# Patient Record
Sex: Male | Born: 1990 | Race: White | Hispanic: No | Marital: Single | Smoking: Current every day smoker
Health system: Southern US, Community
[De-identification: ages and names within clinical notes are randomized; demographics above are authoritative.]

## PROBLEM LIST (undated history)

## (undated) DIAGNOSIS — F909 Attention-deficit hyperactivity disorder, unspecified type: Secondary | ICD-10-CM

## (undated) DIAGNOSIS — J45909 Unspecified asthma, uncomplicated: Secondary | ICD-10-CM

## (undated) DIAGNOSIS — N200 Calculus of kidney: Secondary | ICD-10-CM

## (undated) DIAGNOSIS — I10 Essential (primary) hypertension: Secondary | ICD-10-CM

## (undated) HISTORY — PX: FRACTURE SURGERY: SHX138

## (undated) HISTORY — DX: Unspecified asthma, uncomplicated: J45.909

## (undated) HISTORY — DX: Attention-deficit hyperactivity disorder, unspecified type: F90.9

---

## 2011-05-09 ENCOUNTER — Ambulatory Visit (INDEPENDENT_AMBULATORY_CARE_PROVIDER_SITE_OTHER): Payer: Managed Care, Other (non HMO)

## 2011-05-09 DIAGNOSIS — M545 Low back pain: Secondary | ICD-10-CM

## 2011-05-09 DIAGNOSIS — J111 Influenza due to unidentified influenza virus with other respiratory manifestations: Secondary | ICD-10-CM

## 2011-05-09 DIAGNOSIS — R030 Elevated blood-pressure reading, without diagnosis of hypertension: Secondary | ICD-10-CM

## 2011-08-09 ENCOUNTER — Ambulatory Visit (INDEPENDENT_AMBULATORY_CARE_PROVIDER_SITE_OTHER): Payer: Managed Care, Other (non HMO) | Admitting: Internal Medicine

## 2011-08-09 VITALS — BP 124/90 | HR 80 | Temp 98.9°F | Resp 16 | Ht 66.0 in | Wt 181.2 lb

## 2011-08-09 DIAGNOSIS — M545 Low back pain: Secondary | ICD-10-CM

## 2011-08-09 DIAGNOSIS — R109 Unspecified abdominal pain: Secondary | ICD-10-CM

## 2011-08-09 DIAGNOSIS — R509 Fever, unspecified: Secondary | ICD-10-CM

## 2011-08-09 DIAGNOSIS — R1084 Generalized abdominal pain: Secondary | ICD-10-CM

## 2011-08-09 LAB — POCT CBC
Granulocyte percent: 53.5 %G (ref 37–80)
MCH, POC: 28.1 pg (ref 27–31.2)
MID (cbc): 0.7 (ref 0–0.9)
MPV: 9.1 fL (ref 0–99.8)
POC MID %: 10.7 %M (ref 0–12)
Platelet Count, POC: 189 10*3/uL (ref 142–424)
RBC: 5.72 M/uL (ref 4.69–6.13)

## 2011-08-09 LAB — POCT UA - MICROSCOPIC ONLY
Amorphous: POSITIVE
Casts, Ur, LPF, POC: NEGATIVE

## 2011-08-09 LAB — POCT URINALYSIS DIPSTICK
Leukocytes, UA: NEGATIVE
Nitrite, UA: NEGATIVE
Protein, UA: 30

## 2011-08-09 LAB — COMPREHENSIVE METABOLIC PANEL
ALT: 79 U/L — ABNORMAL HIGH (ref 0–53)
AST: 70 U/L — ABNORMAL HIGH (ref 0–37)
Alkaline Phosphatase: 80 U/L (ref 39–117)
CO2: 27 mEq/L (ref 19–32)
Sodium: 138 mEq/L (ref 135–145)
Total Bilirubin: 0.6 mg/dL (ref 0.3–1.2)
Total Protein: 7.2 g/dL (ref 6.0–8.3)

## 2011-08-09 LAB — POCT SEDIMENTATION RATE: POCT SED RATE: 5 mm/hr (ref 0–22)

## 2011-08-09 MED ORDER — ONDANSETRON HCL 4 MG PO TABS: 4.0000 mg | ORAL_TABLET | Freq: Three times a day (TID) | ORAL | Status: AC | PRN

## 2011-08-09 MED ORDER — ONDANSETRON 4 MG PO TBDP
8.0000 mg | ORAL_TABLET | Freq: Once | ORAL | Status: AC
  Administered 2011-08-09: 8 mg via ORAL

## 2011-08-09 MED ORDER — KETOROLAC TROMETHAMINE 60 MG/2ML IM SOLN
60.0000 mg | Freq: Once | INTRAMUSCULAR | Status: AC
  Administered 2011-08-09: 60 mg via INTRAMUSCULAR

## 2011-08-09 NOTE — Progress Notes (Signed)
  Subjective:    Patient ID: Cameron Bradshaw, male    DOB: 20-May-1990, 21 y.o.   MRN: [id number]  Abdominal Pain This is a new problem. The current episode started in the past 7 days. The onset quality is gradual. The problem occurs constantly. The problem has been gradually worsening. The pain is located in the generalized abdominal region. The pain is at a severity of 5/10. The quality of the pain is aching and sharp. The abdominal pain does not radiate. Associated symptoms include anorexia, constipation and vomiting. Pertinent negatives include no diarrhea or dysuria. The pain is aggravated by coughing. The pain is relieved by nothing. He has tried nothing for the symptoms. There is no history of abdominal surgery.  Cameron Bradshaw is a 21 year old WM here with complaints of 3 days of generalized abdominal pain, worse when he coughs.  He has had fever and myalgias the last few days, roommate has recently had a viral illness of some kind.  He "always has a cough" and has some allergies, having undergone immunotherapy in the past.  He has had little appetite but tells me he has been drinking a lot of water lately.    Review of Systems  HENT: Negative for ear pain.   Eyes: Negative.   Respiratory: Positive for cough.   Cardiovascular: Negative.   Gastrointestinal: Positive for vomiting, abdominal pain, constipation and anorexia. Negative for diarrhea.  Genitourinary: Negative for dysuria, flank pain and discharge.  Musculoskeletal: Negative.   Skin: Negative.   Neurological: Negative.   Hematological: Negative.   Psychiatric/Behavioral: Negative.   All other systems reviewed and are negative.       Objective:   Physical Exam  Vitals reviewed. Constitutional: He is oriented to person, place, and time. He appears well-developed and well-nourished.  HENT:  Head: Normocephalic.  Right Ear: External ear normal.  Left Ear: External ear normal.  Mouth/Throat: Oropharynx is clear and moist. No  oropharyngeal exudate.  Eyes: Conjunctivae are normal.  Neck: Neck supple.  Cardiovascular: Normal rate, regular rhythm and normal heart sounds.   Pulmonary/Chest: Effort normal and breath sounds normal. He has no wheezes. He has no rales. He exhibits no tenderness.  Abdominal: Soft. Bowel sounds are normal. He exhibits no distension and no mass. There is tenderness. There is no rebound and no guarding.  Lymphadenopathy:    He has no cervical adenopathy.  Neurological: He is alert and oriented to person, place, and time.  Skin: Skin is warm and dry.  Psychiatric: He has a normal mood and affect. His behavior is normal.          Assessment & Plan:  Viral syndrome:  Toradol 60 mg given for his myalgias and back pain.  Rest, extra fluids. ESR 5, no sign of systemic inflammation. Nausea improved with Zofran given orally in the office.  His abdominal pain was significantly improved after he moved his bowels in the office.  No sign of acute abdomen or signs of pathology.  CMP pending. Supportive care, BRAT diet, fluids.  If abdominal pain worsens to ED, pt agrees.

## 2011-08-09 NOTE — Patient Instructions (Signed)
You are being treated for a viral syndrome.  Make sure you get extra rest, adequate fluids, and use the Zofran as needed for nausea.  If your abdominal pain worsens, or any other symptoms arise or worsen, return to our clinic or go to the Emergency Room.

## 2011-08-16 ENCOUNTER — Ambulatory Visit (INDEPENDENT_AMBULATORY_CARE_PROVIDER_SITE_OTHER): Payer: Managed Care, Other (non HMO) | Admitting: Family Medicine

## 2011-08-16 VITALS — BP 151/104 | HR 99 | Temp 99.7°F | Resp 16 | Ht 66.5 in | Wt 178.6 lb

## 2011-08-16 DIAGNOSIS — Z202 Contact with and (suspected) exposure to infections with a predominantly sexual mode of transmission: Secondary | ICD-10-CM

## 2011-08-16 DIAGNOSIS — J029 Acute pharyngitis, unspecified: Secondary | ICD-10-CM

## 2011-08-16 DIAGNOSIS — K5900 Constipation, unspecified: Secondary | ICD-10-CM

## 2011-08-16 LAB — POCT CBC
Granulocyte percent: 38.9 %G (ref 37–80)
HCT, POC: 48.7 % (ref 43.5–53.7)
Hemoglobin: 16.6 g/dL (ref 14.1–18.1)
Lymph, poc: 8.7 — AB (ref 0.6–3.4)
MCH, POC: 28.2 pg (ref 27–31.2)
MCHC: 34.1 g/dL (ref 31.8–35.4)
MCV: 82.8 fL (ref 80–97)
MID (cbc): 1.8 — AB (ref 0–0.9)
MPV: 8.8 fL (ref 0–99.8)
POC Granulocyte: 6.7 (ref 2–6.9)
POC LYMPH PERCENT: 50.8 %L — AB (ref 10–50)
POC MID %: 10.3 %M (ref 0–12)
Platelet Count, POC: 255 10*3/uL (ref 142–424)
RBC: 5.88 M/uL (ref 4.69–6.13)
RDW, POC: 15.4 %
WBC: 17.2 10*3/uL — AB (ref 4.6–10.2)

## 2011-08-16 LAB — COMPREHENSIVE METABOLIC PANEL
ALT: 342 U/L — ABNORMAL HIGH (ref 0–53)
AST: 177 U/L — ABNORMAL HIGH (ref 0–37)
Albumin: 4.2 g/dL (ref 3.5–5.2)
Alkaline Phosphatase: 142 U/L — ABNORMAL HIGH (ref 39–117)
BUN: 11 mg/dL (ref 6–23)
CO2: 22 mEq/L (ref 19–32)
Calcium: 8.7 mg/dL (ref 8.4–10.5)
Chloride: 101 mEq/L (ref 96–112)
Creat: 1.03 mg/dL (ref 0.50–1.35)
Glucose, Bld: 120 mg/dL — ABNORMAL HIGH (ref 70–99)
Potassium: 3.3 mEq/L — ABNORMAL LOW (ref 3.5–5.3)
Sodium: 137 mEq/L (ref 135–145)
Total Bilirubin: 0.8 mg/dL (ref 0.3–1.2)
Total Protein: 7.1 g/dL (ref 6.0–8.3)

## 2011-08-16 LAB — POCT RAPID STREP A (OFFICE): Rapid Strep A Screen: NEGATIVE

## 2011-08-16 MED ORDER — CEFTRIAXONE SODIUM 1 G IJ SOLR
1.0000 g | INTRAMUSCULAR | Status: DC
  Administered 2011-08-16: 1 g via INTRAMUSCULAR

## 2011-08-16 MED ORDER — AMOXICILLIN-POT CLAVULANATE 875-125 MG PO TABS: 1.0000 | ORAL_TABLET | Freq: Two times a day (BID) | ORAL | Status: AC

## 2011-08-16 NOTE — Patient Instructions (Signed)
RETURN ON Sunday FOR RECHECK    Peritonsillar Abscess A peritonsillar abscess is a collection of pus located in the back of the throat behind the tonsils. It usually occurs when a streptococcal infection of the throat or tonsils spreads into the space around the tonsils. They are almost always caused by the streptococcal germ (bacteria). The treatment of a peritonsillar abscess is most often drainage accomplished by putting a needle into the abscess or cutting (incising) and draining the abscess. This is most often followed with a course of antibiotics. HOME CARE INSTRUCTIONS  If your abscess was drained by your caregiver today, rinse your throat (gargle) with warm salt water four times per day or as needed for comfort. Do not swallow this mixture. Mix 1 teaspoon of salt in 8 ounces of warm water for gargling.   Rest in bed as needed. Resume activities as able.   Apply cold to your neck for pain relief. Fill a plastic bag with ice and wrap it in a towel. Hold the ice on your neck for 20 minutes 4 times per day.   Eat a soft or liquid diet as tolerated while your throat remains sore. Popsicles and ice cream may be good early choices. Drinking plenty of cold fluids will probably be soothing and help take swelling down in between the warm gargles.   Only take over-the-counter or prescription medicines for pain, discomfort, or fever as directed by your caregiver. Do not use aspirin unless directed by your physician. Aspirin slows down the clotting process. It can also cause bleeding from the drainage area if this was needled or incised today.   If antibiotics were prescribed, take them as directed for the full course of the prescription. Even if you feel you are well, you need to take them.  SEEK MEDICAL CARE IF:   You have increased pain, swelling, redness, or drainage in your throat.   You develop signs of infection such as dizziness, headache, lethargy, or generalized feelings of illness.    You have difficulty breathing, swallowing or eating.   You show signs of becoming dehydrated (lightheadedness when standing, decreased urine output, a fast heart rate, or dry mouth and mucous membranes).  SEEK IMMEDIATE MEDICAL CARE IF:   You have a fever.   You are coughing up or vomiting blood.   You develop more severe throat pain uncontrolled with medicines or you start to drool.   You develop difficulty breathing, talking, or find it easier to breathe while leaning forward.  Document Released: 04/29/2005 Document Revised: 04/18/2011 Document Reviewed: 12/11/2007 [facility] Patient Information 2012 [place], [place].

## 2011-08-16 NOTE — Progress Notes (Signed)
Is a 21 year old gentleman comes in with severe sore throat x3 days. He was here last week with malaise and vague abdominal pain. He still feels a little constipated but his abdominal symptoms of largely clear.  Patient was here last time, he was also feeling very lightheaded and ended up passing out in the bathroom after his blood was drawn. He's very reluctant to have his drawn again She also would like to have STD screening done while he is here.  O: Patient appears acutely ill lying down on the exam bed. He is alert and cooperative  HEENT: Unremarkable except for swollen red posterior pharynx without exudates  Neck: Mild anterior cervical adenopathy with no thyromegaly, supple,  Chest: Clear  Heart: Regular no murmur  Abdomen: Soft nontender without hepatosplenomegaly.  RST:  neg  Results for orders placed in visit on 08/16/11  POCT CBC      Component Value Range   WBC 17.2 (*) 4.6 - 10.2 (K/uL)   Lymph, poc 8.7 (*) 0.6 - 3.4    POC LYMPH PERCENT 50.8 (*) 10 - 50 (%L)   MID (cbc) 1.8 (*) 0 - 0.9    POC MID % 10.3  0 - 12 (%M)   POC Granulocyte 6.7  2 - 6.9    Granulocyte percent 38.9  37 - 80 (%G)   RBC 5.88  4.69 - 6.13 (M/uL)   Hemoglobin 16.6  14.1 - 18.1 (g/dL)   HCT, POC 16.1  09.6 - 53.7 (%)   MCV 82.8  80 - 97 (fL)   MCH, POC 28.2  27 - 31.2 (pg)   MCHC 34.1  31.8 - 35.4 (g/dL)   RDW, POC 04.5     Platelet Count, POC 255  142 - 424 (K/uL)   MPV 8.8  0 - 99.8 (fL)  POCT RAPID STREP A (OFFICE)      Component Value Range   Rapid Strep A Screen Negative  Negative    A:  Peritonsillar cellulitis, need for STD screening  P:  1-check STDs-RPR HIV and URiprobe  2 Rocephin 1 g IM followed by Augmentin 875 twice a day  Followup 48 hours

## 2011-08-17 LAB — CHLAMYDIA PROBE AMPLIFICATION, URINE: Chlamydia, Swab/Urine, PCR: NEGATIVE

## 2011-08-17 LAB — HIV ANTIBODY (ROUTINE TESTING W REFLEX): HIV: NONREACTIVE

## 2011-08-17 LAB — RPR

## 2011-08-18 ENCOUNTER — Ambulatory Visit (INDEPENDENT_AMBULATORY_CARE_PROVIDER_SITE_OTHER): Payer: Managed Care, Other (non HMO) | Admitting: Family Medicine

## 2011-08-18 VITALS — BP 139/99 | HR 99 | Temp 97.9°F | Resp 22 | Ht 65.5 in | Wt 175.2 lb

## 2011-08-18 DIAGNOSIS — R197 Diarrhea, unspecified: Secondary | ICD-10-CM

## 2011-08-18 DIAGNOSIS — J039 Acute tonsillitis, unspecified: Secondary | ICD-10-CM

## 2011-08-18 NOTE — Progress Notes (Signed)
21 yo with severe sore throat, treated with Rocephin and augmentin beginning Friday, now with diarrhea.  He feels the diarrhea actually started before the augmentin.  The throat overall is less sore, but he is just taking the yogurt.  O:  Alert, NAD Throat: less red and swollen Neck:  Anterior cervical nodes less swollen Chest:  Clear Heart: reg, no murmur Abdomen:  Soft, no HSM, normal BS  Results for orders placed in visit on 08/16/11  POCT CBC      Component Value Range   WBC 17.2 (*) 4.6 - 10.2 (K/uL)   Lymph, poc 8.7 (*) 0.6 - 3.4    POC LYMPH PERCENT 50.8 (*) 10 - 50 (%L)   MID (cbc) 1.8 (*) 0 - 0.9    POC MID % 10.3  0 - 12 (%M)   POC Granulocyte 6.7  2 - 6.9    Granulocyte percent 38.9  37 - 80 (%G)   RBC 5.88  4.69 - 6.13 (M/uL)   Hemoglobin 16.6  14.1 - 18.1 (g/dL)   HCT, POC 16.1  09.6 - 53.7 (%)   MCV 82.8  80 - 97 (fL)   MCH, POC 28.2  27 - 31.2 (pg)   MCHC 34.1  31.8 - 35.4 (g/dL)   RDW, POC 04.5     Platelet Count, POC 255  142 - 424 (K/uL)   MPV 8.8  0 - 99.8 (fL)  POCT RAPID STREP A (OFFICE)      Component Value Range   Rapid Strep A Screen Negative  Negative   COMPREHENSIVE METABOLIC PANEL      Component Value Range   Sodium 137  135 - 145 (mEq/L)   Potassium 3.3 (*) 3.5 - 5.3 (mEq/L)   Chloride 101  96 - 112 (mEq/L)   CO2 22  19 - 32 (mEq/L)   Glucose, Bld 120 (*) 70 - 99 (mg/dL)   BUN 11  6 - 23 (mg/dL)   Creat 4.09  8.11 - 9.14 (mg/dL)   Total Bilirubin 0.8  0.3 - 1.2 (mg/dL)   Alkaline Phosphatase 142 (*) 39 - 117 (U/L)   AST 177 (*) 0 - 37 (U/L)   ALT 342 (*) 0 - 53 (U/L)   Total Protein 7.1  6.0 - 8.3 (g/dL)   Albumin 4.2  3.5 - 5.2 (g/dL)   Calcium 8.7  8.4 - 78.2 (mg/dL)  EPSTEIN-BARR VIRUS VCA ANTIBODY PANEL      Component Value Range   EBV VCA IgG       EBV VCA IgM       EBV EA IgG       EBV NA IgG      HIV ANTIBODY (ROUTINE TESTING)      Component Value Range   HIV NON REACTIVE  NON REACTIVE   CHLAMYDIA PROBE AMPLIFICATION, URINE      Component Value Range   Chlamydia, Swab/Urine, PCR NEGATIVE  NEGATIVE   RPR      Component Value Range   RPR NON REAC  NON REAC   Labs reviewed with patient  A:  Acute sore throat, improving.  The diarrhea seems tolerable.  P:  Add culturelle Continue with the augmentin.

## 2011-08-18 NOTE — Progress Notes (Signed)
21 yo man with severe sorethroat and high WBC seen two days ago and given Rocephin.  Also started on augmentin and now has diarrhea.

## 2011-08-18 NOTE — Patient Instructions (Addendum)
Start culturelle daily   Diarrhea Infections caused by germs (bacterial) or a virus commonly cause diarrhea. Your caregiver has determined that with time, rest and fluids, the diarrhea should improve. In general, eat normally while drinking more water than usual. Although water may prevent dehydration, it does not contain salt and minerals (electrolytes). Broths, weak tea without caffeine and oral rehydration solutions (ORS) replace fluids and electrolytes. Small amounts of fluids should be taken frequently. Large amounts at one time may not be tolerated. Plain water may be harmful in infants and the elderly. Oral rehydrating solutions (ORS) are available at pharmacies and grocery stores. ORS replace water and important electrolytes in proper proportions. Sports drinks are not as effective as ORS and may be harmful due to sugars worsening diarrhea.  ORS is especially recommended for use in children with diarrhea. As a general guideline for children, replace any new fluid losses from diarrhea and/or vomiting with ORS as follows:   If your child weighs 22 pounds or under (10 kg or less), give 60-120 mL ( -  cup or 2 - 4 ounces) of ORS for each episode of diarrheal stool or vomiting episode.   If your child weighs more than 22 pounds (more than 10 kgs), give 120-240 mL ( - 1 cup or 4 - 8 ounces) of ORS for each diarrheal stool or episode of vomiting.   While correcting for dehydration, children should eat normally. However, foods high in sugar should be avoided because this may worsen diarrhea. Large amounts of carbonated soft drinks, juice, gelatin desserts and other highly sugared drinks should be avoided.   After correction of dehydration, other liquids that are appealing to the child may be added. Children should drink small amounts of fluids frequently and fluids should be increased as tolerated. Children should drink enough fluids to keep urine clear or pale yellow.   Adults should eat  normally while drinking more fluids than usual. Drink small amounts of fluids frequently and increase as tolerated. Drink enough fluids to keep urine clear or pale yellow. Broths, weak decaffeinated tea, lemon lime soft drinks (allowed to go flat) and ORS replace fluids and electrolytes.   Avoid:   Carbonated drinks.   Juice.   Extremely hot or cold fluids.   Caffeine drinks.   Fatty, greasy foods.   Alcohol.   Tobacco.   Too much intake of anything at one time.   Gelatin desserts.   Probiotics are active cultures of beneficial bacteria. They may lessen the amount and number of diarrheal stools in adults. Probiotics can be found in yogurt with active cultures and in supplements.   Wash hands well to avoid spreading bacteria and virus.   Anti-diarrheal medications are not recommended for infants and children.   Only take over-the-counter or prescription medicines for pain, discomfort or fever as directed by your caregiver. Do not give aspirin to children because it may cause Reye's Syndrome.   For adults, ask your caregiver if you should continue all prescribed and over-the-counter medicines.   If your caregiver has given you a follow-up appointment, it is very important to keep that appointment. Not keeping the appointment could result in a chronic or permanent injury, and disability. If there is any problem keeping the appointment, you must call back to this facility for assistance.  SEEK IMMEDIATE MEDICAL CARE IF:   You or your child is unable to keep fluids down or other symptoms or problems become worse in spite of treatment.   Vomiting or  diarrhea develops and becomes persistent.   There is vomiting of blood or bile (green material).   There is blood in the stool or the stools are black and tarry.   There is no urine output in 6-8 hours or there is only a small amount of very dark urine.   Abdominal pain develops, increases or localizes.   You have a fever.    Your baby is older than 3 months with a rectal temperature of 102 F (38.9 C) or higher.   Your baby is 66 months old or younger with a rectal temperature of 100.4 F (38 C) or higher.   You or your child develops excessive weakness, dizziness, fainting or extreme thirst.   You or your child develops a rash, stiff neck, severe headache or become irritable or sleepy and difficult to awaken.  MAKE SURE YOU:   Understand these instructions.   Will watch your condition.   Will get help right away if you are not doing well or get worse.  Document Released: 04/19/2002 Document Revised: 04/18/2011 Document Reviewed: 03/06/2009 [facility] Patient Information 2012 [place], [place].

## 2011-08-19 LAB — EPSTEIN-BARR VIRUS VCA ANTIBODY PANEL
EBV EA IgG: 0.86 {ISR}
EBV NA IgG: 0.09 {ISR}
EBV VCA IgG: 2.39 {ISR} — ABNORMAL HIGH
EBV VCA IgM: 1.1 {ISR} — ABNORMAL HIGH

## 2011-08-20 ENCOUNTER — Other Ambulatory Visit: Payer: Self-pay | Admitting: Family Medicine

## 2011-08-20 MED ORDER — PREDNISONE 20 MG PO TABS: ORAL_TABLET | ORAL | Status: DC

## 2011-10-12 ENCOUNTER — Emergency Department (HOSPITAL_COMMUNITY)
Admission: EM | Admit: 2011-10-12 | Discharge: 2011-10-12 | Disposition: A | Discharge status: Home / Self Care | Payer: Managed Care, Other (non HMO) | Attending: Emergency Medicine | Admitting: Emergency Medicine

## 2011-10-12 ENCOUNTER — Emergency Department (HOSPITAL_COMMUNITY): Payer: Managed Care, Other (non HMO)

## 2011-10-12 ENCOUNTER — Encounter (HOSPITAL_COMMUNITY): Payer: Self-pay | Admitting: *Deleted

## 2011-10-12 DIAGNOSIS — R112 Nausea with vomiting, unspecified: Secondary | ICD-10-CM | POA: Insufficient documentation

## 2011-10-12 DIAGNOSIS — R109 Unspecified abdominal pain: Secondary | ICD-10-CM | POA: Insufficient documentation

## 2011-10-12 DIAGNOSIS — N201 Calculus of ureter: Secondary | ICD-10-CM

## 2011-10-12 DIAGNOSIS — N200 Calculus of kidney: Secondary | ICD-10-CM | POA: Insufficient documentation

## 2011-10-12 HISTORY — DX: Calculus of kidney: N20.0

## 2011-10-12 LAB — URINALYSIS, ROUTINE W REFLEX MICROSCOPIC
Bilirubin Urine: NEGATIVE
Glucose, UA: NEGATIVE mg/dL
Ketones, ur: NEGATIVE mg/dL
Nitrite: NEGATIVE
Protein, ur: NEGATIVE mg/dL
Specific Gravity, Urine: 1.022 (ref 1.005–1.030)
Urobilinogen, UA: 0.2 mg/dL (ref 0.0–1.0)
pH: 6 (ref 5.0–8.0)

## 2011-10-12 LAB — POCT I-STAT, CHEM 8
BUN: 9 mg/dL (ref 6–23)
Calcium, Ion: 1.2 mmol/L (ref 1.12–1.32)
Chloride: 104 mEq/L (ref 96–112)
Creatinine, Ser: 1 mg/dL (ref 0.50–1.35)
Glucose, Bld: 112 mg/dL — ABNORMAL HIGH (ref 70–99)
HCT: 52 % (ref 39.0–52.0)
Hemoglobin: 17.7 g/dL — ABNORMAL HIGH (ref 13.0–17.0)
Potassium: 3.8 mEq/L (ref 3.5–5.1)
Sodium: 143 mEq/L (ref 135–145)
TCO2: 25 mmol/L (ref 0–100)

## 2011-10-12 LAB — URINE MICROSCOPIC-ADD ON

## 2011-10-12 MED ORDER — HYDROMORPHONE HCL PF 1 MG/ML IJ SOLN
1.0000 mg | Freq: Once | INTRAMUSCULAR | Status: AC
  Administered 2011-10-12: 1 mg via INTRAVENOUS
  Filled 2011-10-12: qty 1

## 2011-10-12 MED ORDER — OXYCODONE-ACETAMINOPHEN 5-325 MG PO TABS: 1.0000 | ORAL_TABLET | ORAL | Status: AC | PRN

## 2011-10-12 MED ORDER — KETOROLAC TROMETHAMINE 15 MG/ML IJ SOLN
15.0000 mg | Freq: Once | INTRAMUSCULAR | Status: AC
  Administered 2011-10-12: 15 mg via INTRAVENOUS
  Filled 2011-10-12: qty 1

## 2011-10-12 MED ORDER — SODIUM CHLORIDE 0.9 % IV BOLUS (SEPSIS)
1000.0000 mL | Freq: Once | INTRAVENOUS | Status: AC
  Administered 2011-10-12: 1000 mL via INTRAVENOUS

## 2011-10-12 MED ORDER — ONDANSETRON HCL 4 MG/2ML IJ SOLN
4.0000 mg | Freq: Once | INTRAMUSCULAR | Status: AC
  Administered 2011-10-12: 4 mg via INTRAVENOUS
  Filled 2011-10-12: qty 2

## 2011-10-12 MED ORDER — TAMSULOSIN HCL 0.4 MG PO CAPS: 0.4000 mg | ORAL_CAPSULE | Freq: Every day | ORAL | Status: DC

## 2011-10-12 NOTE — ED Provider Notes (Signed)
History    21 year old male with left flank pain. Acute onset around 9:00 this morning. Sharp and very uncomfortable. History of prior kidney stones and feels similar. No fevers or chills. Nausea and has vomited once. Nonbilious nonbloody. Hematuria, otherwise no urinary complaints.  CSN: [id number]  Arrival date & time 10/12/11  2130   First MD Initiated Contact with Patient 10/12/11 [phone]      Chief Complaint  Patient presents with  . Flank Pain    left  . Nausea  . Emesis    (Consider location/radiation/quality/duration/timing/severity/associated sxs/prior treatment) HPI  Past Medical History  Diagnosis Date  . Kidney stones     Past Surgical History  Procedure Date  . Fracture surgery     History reviewed. No pertinent family history.  History  Substance Use Topics  . Smoking status: Current Everyday Smoker -- 0.5 packs/day    Types: Cigarettes  . Smokeless tobacco: Never Used  . Alcohol Use: Yes     occ      Review of Systems   Review of symptoms negative unless otherwise noted in HPI.   Allergies  Review of patient's allergies indicates no known allergies.  Home Medications   Current Outpatient Rx  Name Route Sig Dispense Refill  . OMEPRAZOLE 10 MG PO CPDR Oral Take 10 mg by mouth daily.    Marland Kitchen PREDNISONE 20 MG PO TABS  2 daily x 5 days with food 10 tablet 0    BP 170/116  Pulse 84  Temp(Src) 99.1 F (37.3 C) (Oral)  Resp 22  SpO2 99%  Physical Exam  Nursing note and vitals reviewed. Constitutional: He appears well-developed and well-nourished.       Writhing around on the bed and uncomfortable appearing. Wimpering.  HENT:  Head: Normocephalic and atraumatic.  Eyes: Conjunctivae are normal. Right eye exhibits no discharge. Left eye exhibits no discharge.  Neck: Neck supple.  Cardiovascular: Normal rate, regular rhythm and normal heart sounds.  Exam reveals no gallop and no friction rub.   No murmur heard. Pulmonary/Chest: Effort normal  and breath sounds normal. No respiratory distress.  Abdominal: Soft. He exhibits no distension. There is no tenderness.  Genitourinary:       No costovertebral angle  Musculoskeletal: He exhibits no edema and no tenderness.  Neurological: He is alert.  Skin: Skin is warm and dry.  Psychiatric: He has a normal mood and affect. His behavior is normal. Thought content normal.    ED Course  Procedures (including critical care time)   Labs Reviewed  URINALYSIS, ROUTINE W REFLEX MICROSCOPIC   Ct Abdomen Pelvis Wo Contrast  10/12/2011  *RADIOLOGY REPORT*  Clinical Data: Left flank pain.  Nausea and emesis  CT ABDOMEN AND PELVIS WITHOUT CONTRAST  Technique:  Multidetector CT imaging of the abdomen and pelvis was performed following the standard protocol without intravenous contrast.  Comparison: None  Findings:  No focal splenic abnormality.  No focal liver abnormalities identified.  The gallbladder appears normal.  No biliary dilatation.  The pancreas appears within normal limits.  Both adrenal glands are normal.  No right-sided nephrolithiasis or obstructive uropathy. Tiny stone is noted within the upper pole of the left kidney measuring approximately 2 mm.  Within the lower pole of the left kidney there is a stone which measures 6.2 mm, image 33.  There is mild left-sided pelvocaliectasis within the distal left ureter there is a tiny stone which measures approximately 2-3 mm.  The bladder appears normal.  The prostate gland  and seminal vesicles are unremarkable.  The stomach and the small bowel loops are unremarkable.  The appendix is identified and appears normal.  Normal appearance of the colon.  No free fluid or fluid collections noted.  Review of the visualized bony structures is unremarkable.  IMPRESSION:  1.  Distal left ureteral calculus measures 2-3 mm. 2.  Left renal calculi.  Original Report Authenticated By: Rosealee Albee, M.D.    10:37 AM CT scan personally reviewed. Small ureteral  stone at the left UVJ.  1. Ureteral stone       MDM  21 year old male with left flank pain. Imaging significant for a left ureteral stone. Given size, patient should be able spontaneously pass this. Renal function is normal. UA is not consistent with infection. Patient is afebrile and HD stable. Plan pain medication, expectant management and urology followup.       Raeford Razor, MD 10/12/11 1259

## 2011-10-12 NOTE — Discharge Instructions (Signed)
Kidney Stones Kidney stones (ureteral lithiasis) are solid masses that form inside your kidneys. The intense pain is caused by the stone moving through the kidney, ureter, bladder, and urethra (urinary tract). When the stone moves, the ureter starts to spasm around the stone. The stone is usually passed in the urine.  HOME CARE  Drink enough fluids to keep your pee (urine) clear or pale yellow. This helps to get the stone out.   Strain all pee through the provided strainer. Do not pee without peeing through the strainer, not even once. If you pee the stone out, catch it. The stone may be as small as a grain of salt. Take this to your doctor.   Only take medicine as told by your doctor.   Follow up with your doctor as told.   Get follow-up X-rays as told by your doctor.  GET HELP RIGHT AWAY IF:   Your pain does not get better with medicine.   You have a fever.   Your pain increases and gets worse over 18 hours.   You have new belly (abdominal) pain.   You feel faint or pass out.  MAKE SURE YOU:   Understand these instructions.   Will watch your condition.   Will get help right away if you are not doing well or get worse.  Document Released: 10/16/2007 Document Revised: 04/18/2011 Document Reviewed: 02/24/2009 [facility] Patient Information 2012 [place], [place].Ureteral Colic (Kidney Stones) Ureteral colic is the result of a condition when kidney stones form inside the kidney. Once kidney stones are formed they may move into the tube that connects the kidney with the bladder (ureter). If this occurs, this condition may cause pain (colic) in the ureter.  CAUSES  Pain is caused by stone movement in the ureter and the obstruction caused by the stone. SYMPTOMS  The pain comes and goes as the ureter contracts around the stone. The pain is usually intense, sharp, and stabbing in character. The location of the pain may move as the stone moves through the ureter. When the stone is near the  kidney the pain is usually located in the back and radiates to the belly (abdomen). When the stone is ready to pass into the bladder the pain is often located in the lower abdomen on the side the stone is located. At this location, the symptoms may mimic those of a urinary tract infection with urinary frequency. Once the stone is located here it often passes into the bladder and the pain disappears completely. TREATMENT   Your caregiver will provide you with medicine for pain relief.   You may require specialized follow-up X-rays.   The absence of pain does not always mean that the stone has passed. It may have just stopped moving. If the urine remains completely obstructed, it can cause loss of kidney function or even complete destruction of the involved kidney. It is your responsibility and in your interest that X-rays and follow-ups as suggested by your caregiver are completed. Relief of pain without passage of the stone can be associated with severe damage to the kidney, including loss of kidney function on that side.   If your stone does not pass on its own, additional measures may be taken by your caregiver to ensure its removal.  HOME CARE INSTRUCTIONS   Increase your fluid intake. Water is the preferred fluid since juices containing vitamin C may acidify the urine making it less likely for certain stones (uric acid stones) to pass.   Strain  all urine. A strainer will be provided. Keep all particulate matter or stones for your caregiver to inspect.   Take your pain medicine as directed.   Make a follow-up appointment with your caregiver as directed.   Remember that the goal is passage of your stone. The absence of pain does not mean the stone is gone. Follow your caregiver's instructions.   Only take over-the-counter or prescription medicines for pain, discomfort, or fever as directed by your caregiver.  SEEK MEDICAL CARE IF:   Pain cannot be controlled with the prescribed medicine.     You have a fever.   Pain continues for longer than your caregiver advises it should.   There is a change in the pain, and you develop chest discomfort or constant abdominal pain.   You feel faint or pass out.  MAKE SURE YOU:   Understand these instructions.   Will watch your condition.   Will get help right away if you are not doing well or get worse.  Document Released: 02/06/2005 Document Revised: 04/18/2011 Document Reviewed: 10/24/2010 [facility] Patient Information 2012 [place], [place].  RESOURCE GUIDE  Chronic Pain Problems: Contact [facility]  [phone] Patients need to be referred by their primary care doctor.  Insufficient Money for Medicine: Contact United Way:  call "211" or [facility] [phone].  No Primary Care Doctor: - Call [facility]  [phone] - can help you locate a primary care doctor that  accepts your insurance, provides certain services, etc. - Physician Referral [phone]  Agencies that provide inexpensive medical care: - [facility]  [phone] - [facility]  [phone] - [facility]  [phone] [facility]  [phone] - [facility]  [phone] [facility]  [phone]  Medicaid-accepting [facility] Providers: - [facility]- [address]  [phone], Mon-Fri 9am-7pm, Sat 9am-1pm - [facility]- [address] [place], [address] [place]  [phone] - [facility]- [address], [address] [place]  [phone] [facility]- [address]  [phone] - Renaye Rakers- [address] [place], [address], [phone]  Only accepts [place] patients after they have their name  applied to their card  Self Pay (no insurance) in [place]: - Sickle Cell Patients: Dr Willey Blade, [facility]  [address] [place], [phone] - [facility]  Urgent Care- [address] [place]  [phone]       [facility]- [address]       -     [facility]- see information above (Speak to [employer] if you do not have insurance)       -  [facility]- [address] [place], [phone]       -  [facility]- [address],  [phone]       -  [facility]- [address], [phone]       -  Dr Julio Sicks-  [address], [address], [place], [phone]       -  [facility]- [address] [phone]       -  [facility]- [address] [phone], also [address], [phone]       -    [facility]- [address] [place], [phone], 1st & 3rd Saturday   every month, 10am-1pm  1) Find a Doctor and Pay Out of Pocket Although you won't have to find out who is covered by your insurance plan, it is a good idea to ask around and get recommendations. You will then need to call the office and see if the doctor you have chosen will accept you as a new patient and what types of options they offer for patients who are self-pay. Some doctors offer discounts or will set up payment plans for their patients who do not have insurance, but you will need to ask so you aren't surprised when you get to your appointment.  2) Contact Your Local Health Department Not all health departments have doctors that can see patients for sick visits, but many do, so it is worth a call to see if yours does. If you don't know where your local health department is, you can check in your phone book. The CDC also has a tool to help you locate your state's health department, and many state websites also have listings of all of their local health departments.  3) Find a Walk-in Clinic If your illness is not likely to be very severe or complicated, you may want to try a walk in clinic. These are popping up all over the country in pharmacies, drugstores, and shopping centers.  They're usually staffed by nurse practitioners or physician assistants that have been trained to treat common illnesses and complaints. They're usually fairly quick and inexpensive. However, if you have serious medical issues or chronic medical problems, these are probably not your best option  STD Testing - [facility] Department of [facility] [place], STD Clinic, [address] [place], phone [phone] or [phone].  Monday - Friday, call for an appointment. [facility] Department of [employer], STD Clinic, [address], [place], phone [phone] or [phone].  Monday - Friday, call for an appointment.  Abuse/Neglect: [facility] Child Abuse Hotline [phone] [facility] Child Abuse Hotline [phone] (After Hours)  Emergency Shelter:  [facility] 207-273-9821  Maternity Homes: - [facility] [phone] - [facility] [phone]  MRSA Hotline #:   (347) 124-7570  [facility] Resources  [facility] [place]  [employer] [facility] Dept. [address]                 [address]         [address] [place] [address]  Blondell Reveal Phone:  [phone]                                  Phone:  [phone]                   Phone:  [phone]  [facility], [phone] - [facility] Services - [facility]- [phone]       -     [facility] in [place], [address]                                  [phone], [facility] Child Abuse Hotline (365)377-2302 or 574-032-2091 (After Hours)   Behavioral Health Services  Substance Abuse Resources: - [facility]  (647) [phone] - [facility] [phone] - [facility] [place]  [phone] Floydene Flock [phone] - Residential & Outpatient Substance Abuse Program  [phone]  Psychological Services: [facility]  [phone] [facility]  [phone] - [facility], [phone] [place]. [address] [place], ACCESS LINE: [phone] or [phone], [employer]  Dental Assistance  If unable to pay or uninsured, contact:  [facility] or [facility]. to become qualified for the adult dental clinic.  Patients with Medicaid: [facility] [phone] [address], [phone] [address], [phone]  If unable to pay, or uninsured, contact [facility] [phone]) or [facility] Department [phone] in [place], [phone] in [facility]) to become qualified for the adult dental clinic  Other Low-Cost Community Dental Services: - [facility]- [address] [place], [place], [place], [address], [phone], [phone], 2nd and 4th Thursday of the month at 6:30am.  10 clients each day by appointment, can sometimes see walk-in patients if someone does not show for an appointment. [facility]- [address] [place], [place], [address], [phone] - [facility]- [address], [place], [place], [address], [phone] - [facility]- [phone] [facility] Health Department- [phone] [facility] Department- [phone]

## 2011-10-12 NOTE — ED Notes (Signed)
Pt from home with reports left flank pain, nausea and emesis that started this morning at 0900. Pt endorses hx of kidney stone with similar symptoms about 6 months ago.

## 2011-10-13 ENCOUNTER — Emergency Department (HOSPITAL_COMMUNITY)
Admission: EM | Admit: 2011-10-13 | Discharge: 2011-10-13 | Disposition: A | Discharge status: Home / Self Care | Payer: Managed Care, Other (non HMO) | Attending: Emergency Medicine | Admitting: Emergency Medicine

## 2011-10-13 ENCOUNTER — Encounter (HOSPITAL_COMMUNITY): Payer: Self-pay | Admitting: Emergency Medicine

## 2011-10-13 DIAGNOSIS — F17200 Nicotine dependence, unspecified, uncomplicated: Secondary | ICD-10-CM | POA: Insufficient documentation

## 2011-10-13 DIAGNOSIS — R109 Unspecified abdominal pain: Secondary | ICD-10-CM | POA: Insufficient documentation

## 2011-10-13 DIAGNOSIS — R1032 Left lower quadrant pain: Secondary | ICD-10-CM | POA: Insufficient documentation

## 2011-10-13 DIAGNOSIS — Z87442 Personal history of urinary calculi: Secondary | ICD-10-CM | POA: Insufficient documentation

## 2011-10-13 DIAGNOSIS — N200 Calculus of kidney: Secondary | ICD-10-CM

## 2011-10-13 LAB — BASIC METABOLIC PANEL
BUN: 11 mg/dL (ref 6–23)
Chloride: 101 mEq/L (ref 96–112)
GFR calc Af Amer: >90 mL/min (ref >90)
GFR calc non Af Amer: 88 mL/min — ABNORMAL LOW (ref >90)
Glucose, Bld: 100 mg/dL — ABNORMAL HIGH (ref 70–99)
Potassium: 3.7 mEq/L (ref 3.5–5.1)
Sodium: 137 mEq/L (ref 135–145)

## 2011-10-13 LAB — DIFFERENTIAL
Eosinophils Absolute: 0.3 10*3/uL (ref 0.0–0.7)
Lymphs Abs: 4.7 10*3/uL — ABNORMAL HIGH (ref 0.7–4.0)
Monocytes Relative: 7 % (ref 3–12)
Neutro Abs: 5 10*3/uL (ref 1.7–7.7)
Neutrophils Relative %: 47 % (ref 43–77)

## 2011-10-13 LAB — URINALYSIS, ROUTINE W REFLEX MICROSCOPIC
Bilirubin Urine: NEGATIVE
Nitrite: NEGATIVE
Specific Gravity, Urine: 1.015 (ref 1.005–1.030)
Urobilinogen, UA: 0.2 mg/dL (ref 0.0–1.0)
pH: 7.5 (ref 5.0–8.0)

## 2011-10-13 LAB — CBC
Hemoglobin: 16.3 g/dL (ref 13.0–17.0)
Platelets: 250 10*3/uL (ref 150–400)
RBC: 5.71 MIL/uL (ref 4.22–5.81)
WBC: 10.7 10*3/uL — ABNORMAL HIGH (ref 4.0–10.5)

## 2011-10-13 LAB — URINE MICROSCOPIC-ADD ON

## 2011-10-13 MED ORDER — MORPHINE SULFATE 4 MG/ML IJ SOLN
4.0000 mg | Freq: Once | INTRAMUSCULAR | Status: AC
  Administered 2011-10-13: 4 mg via INTRAVENOUS
  Filled 2011-10-13: qty 1

## 2011-10-13 MED ORDER — HYDROMORPHONE HCL PF 2 MG/ML IJ SOLN
2.0000 mg | Freq: Once | INTRAMUSCULAR | Status: AC
  Administered 2011-10-13: 2 mg via INTRAVENOUS
  Filled 2011-10-13: qty 1

## 2011-10-13 MED ORDER — ONDANSETRON HCL 4 MG/2ML IJ SOLN
4.0000 mg | Freq: Once | INTRAMUSCULAR | Status: AC
Start: 2011-10-13 — End: 2011-10-13
  Administered 2011-10-13: 4 mg via INTRAVENOUS
  Filled 2011-10-13: qty 2

## 2011-10-13 MED ORDER — ONDANSETRON HCL 4 MG/2ML IJ SOLN
4.0000 mg | Freq: Once | INTRAMUSCULAR | Status: AC
  Administered 2011-10-13: 4 mg via INTRAVENOUS

## 2011-10-13 MED ORDER — ONDANSETRON HCL 4 MG/2ML IJ SOLN
INTRAMUSCULAR | Status: AC
  Filled 2011-10-13: qty 2

## 2011-10-13 MED ORDER — KETOROLAC TROMETHAMINE 30 MG/ML IJ SOLN
30.0000 mg | Freq: Once | INTRAMUSCULAR | Status: AC
  Administered 2011-10-13: 30 mg via INTRAVENOUS
  Filled 2011-10-13: qty 1

## 2011-10-13 MED ORDER — HYDROMORPHONE HCL PF 1 MG/ML IJ SOLN
1.0000 mg | Freq: Once | INTRAMUSCULAR | Status: AC
  Administered 2011-10-13: 1 mg via INTRAVENOUS
  Filled 2011-10-13: qty 1

## 2011-10-13 NOTE — ED Notes (Signed)
Pt alert, c/o left flank pain, onset this am around 4am, seen in Ed yesterday dx Kidney Stone, resp even unlabored, skin pwd

## 2011-10-13 NOTE — ED Provider Notes (Signed)
History     CSN: [id number]  Arrival date & time 10/13/11  0534   First MD Initiated Contact with Patient 10/13/11 [phone]      Chief Complaint  Patient presents with  . Flank Pain    (Consider location/radiation/quality/duration/timing/severity/associated sxs/prior treatment) HPI  Patient presents to the ED with complaints of left flank pain. He was seen yesterday and diagnosed with a left 2-80mm uretal stone. He says he woke up in the middle of the night with strong pain that radiates down to his left testicle. The pain is very sharp. He  Tried taking his Percocet and waited 1.5 hours but the pain was too strong so he came back to the ED. The patient is in no acute distress but is very uncomfortable and writhing in pain. His vital signs are stable.   Past Medical History  Diagnosis Date  . Kidney stones     Past Surgical History  Procedure Date  . Fracture surgery     No family history on file.  History  Substance Use Topics  . Smoking status: Current Everyday Smoker -- 0.5 packs/day    Types: Cigarettes  . Smokeless tobacco: Never Used  . Alcohol Use: Yes     occ      Review of Systems   HEENT: denies blurry vision or change in hearing PULMONARY: Denies difficulty breathing and SOB CARDIAC: denies chest pain or heart palpitations MUSCULOSKELETAL:  denies being unable to ambulate ABDOMEN AL: denies vomiting and diahrea  GU: denies loss of bowel or urinary control NEURO: denies numbness and tingling in extremities SKIN: no new rashes PSYCH: patient behavior is normal NECK: Not complaining of neck pain     Allergies  Review of patient's allergies indicates no known allergies.  Home Medications   Current Outpatient Rx  Name Route Sig Dispense Refill  . OMEPRAZOLE 10 MG PO CPDR Oral Take 10 mg by mouth daily as needed. For indigestion    . OXYCODONE-ACETAMINOPHEN 5-325 MG PO TABS Oral Take 1-2 tablets by mouth every 4 (four) hours as needed for pain. 20  tablet 0  . TAMSULOSIN HCL 0.4 MG PO CAPS Oral Take 1 capsule (0.4 mg total) by mouth daily. 7 capsule 0    BP 141/98  Pulse 84  Temp(Src) 98 F (36.7 C) (Oral)  Resp 18  SpO2 98%  Physical Exam  Nursing note and vitals reviewed. Constitutional: He appears well-developed and well-nourished. No distress.  HENT:  Head: Normocephalic and atraumatic.  Eyes: Pupils are equal, round, and reactive to light.  Neck: Normal range of motion. Neck supple.  Cardiovascular: Normal rate and regular rhythm.   Pulmonary/Chest: Effort normal.  Abdominal: Soft. He exhibits no distension. There is tenderness (LLQ pain). There is no rebound and no guarding.  Neurological: He is alert.  Skin: Skin is warm and dry.    ED Course  Procedures (including critical care time)  Labs Reviewed  CBC - Abnormal; Notable for the following:    WBC 10.7 (*)    All other components within normal limits  DIFFERENTIAL - Abnormal; Notable for the following:    Lymphs Abs 4.7 (*)    All other components within normal limits  BASIC METABOLIC PANEL - Abnormal; Notable for the following:    Glucose, Bld 100 (*)    GFR calc non Af Amer 88 (*)    All other components within normal limits  URINALYSIS, ROUTINE W REFLEX MICROSCOPIC - Abnormal; Notable for the following:  Hgb urine dipstick LARGE (*)    All other components within normal limits  URINE MICROSCOPIC-ADD ON - Abnormal; Notable for the following:    Bacteria, UA FEW (*)    All other components within normal limits   Ct Abdomen Pelvis Wo Contrast  10/12/2011  *RADIOLOGY REPORT*  Clinical Data: Left flank pain.  Nausea and emesis  CT ABDOMEN AND PELVIS WITHOUT CONTRAST  Technique:  Multidetector CT imaging of the abdomen and pelvis was performed following the standard protocol without intravenous contrast.  Comparison: None  Findings:  No focal splenic abnormality.  No focal liver abnormalities identified.  The gallbladder appears normal.  No biliary  dilatation.  The pancreas appears within normal limits.  Both adrenal glands are normal.  No right-sided nephrolithiasis or obstructive uropathy. Tiny stone is noted within the upper pole of the left kidney measuring approximately 2 mm.  Within the lower pole of the left kidney there is a stone which measures 6.2 mm, image 33.  There is mild left-sided pelvocaliectasis within the distal left ureter there is a tiny stone which measures approximately 2-3 mm.  The bladder appears normal.  The prostate gland and seminal vesicles are unremarkable.  The stomach and the small bowel loops are unremarkable.  The appendix is identified and appears normal.  Normal appearance of the colon.  No free fluid or fluid collections noted.  Review of the visualized bony structures is unremarkable.  IMPRESSION:  1.  Distal left ureteral calculus measures 2-3 mm. 2.  Left renal calculi.  Original Report Authenticated By: Rosealee Albee, M.D.     1. Kidney stones       MDM  Patient had CT scan less than 24 hours ago. Pain is normal kidney stone pain but uncontrolled at the time. Patient given 1mg  Dilaudid and 30mg  IV Toradol.   7:14 AM- patient re-evaluated, still writing in pain. Patients urinal in room shows he is actively passing very small stones.  Dilaudid 2mg  IV ordered.   8:30 AM- Patient no longer writhing in pain. Pain is 4/10, but he is still whimpering. Pt has passed another large stone. Will continue to monitor patient.   9:30 AM- patient has passed another stone. Informs me that he still feels one there, but that his pain is a 4/10 and sharp. I have informed that patient that I will give some more pain medication, 4mg  IV Morphine ordered and that I will observe him 1 more hour and if his pain continues to be low I will D/C him. His labs are non acute, large amount of blood on urinalysis.  10:10 AM - patient still having pain but informs me he is hungry and ready to go. Pain is 4/10. Patient has a urology  referral and promises to call the urologist tomorrow. Parents in room with patient and are going to drive him home. \ Pt has been advised of the symptoms that warrant their return to the ED. Patient has voiced understanding and has agreed to follow-up with the PCP or specialist.   Dorthula Matas, PA 10/13/11 1013

## 2011-10-13 NOTE — Discharge Instructions (Signed)

## 2011-10-13 NOTE — ED Notes (Signed)
PA at bedside.

## 2011-10-20 NOTE — ED Provider Notes (Signed)
Medical screening examination/treatment/procedure(s) were performed by non-physician practitioner and as supervising physician I was immediately available for consultation/collaboration.  Doug Sou, MD 10/20/11 [phone]

## 2011-11-27 ENCOUNTER — Encounter (HOSPITAL_COMMUNITY): Payer: Self-pay | Admitting: Emergency Medicine

## 2011-11-27 ENCOUNTER — Emergency Department (HOSPITAL_COMMUNITY)
Admission: EM | Admit: 2011-11-27 | Discharge: 2011-11-27 | Disposition: A | Discharge status: Home / Self Care | Payer: Managed Care, Other (non HMO) | Attending: Emergency Medicine | Admitting: Emergency Medicine

## 2011-11-27 DIAGNOSIS — F17200 Nicotine dependence, unspecified, uncomplicated: Secondary | ICD-10-CM | POA: Insufficient documentation

## 2011-11-27 DIAGNOSIS — N200 Calculus of kidney: Secondary | ICD-10-CM | POA: Insufficient documentation

## 2011-11-27 DIAGNOSIS — R109 Unspecified abdominal pain: Secondary | ICD-10-CM

## 2011-11-27 LAB — POCT I-STAT, CHEM 8
BUN: 13 mg/dL (ref 6–23)
Calcium, Ion: 1.2 mmol/L (ref 1.12–1.23)
Creatinine, Ser: 1.2 mg/dL (ref 0.50–1.35)
Glucose, Bld: 88 mg/dL (ref 70–99)
Hemoglobin: 16 g/dL (ref 13.0–17.0)
TCO2: 22 mmol/L (ref 0–100)

## 2011-11-27 LAB — URINALYSIS, ROUTINE W REFLEX MICROSCOPIC
Bilirubin Urine: NEGATIVE
Glucose, UA: NEGATIVE mg/dL
Leukocytes, UA: NEGATIVE
Nitrite: NEGATIVE
Protein, ur: NEGATIVE mg/dL
Specific Gravity, Urine: 1.019 (ref 1.005–1.030)
Urobilinogen, UA: 0.2 mg/dL (ref 0.0–1.0)
pH: 6 (ref 5.0–8.0)

## 2011-11-27 LAB — CBC
MCHC: 35.1 g/dL (ref 30.0–36.0)
RDW: 13.9 % (ref 11.5–15.5)
WBC: 11.5 10*3/uL — ABNORMAL HIGH (ref 4.0–10.5)

## 2011-11-27 MED ORDER — HYDROMORPHONE HCL PF 2 MG/ML IJ SOLN
2.0000 mg | Freq: Once | INTRAMUSCULAR | Status: AC
  Administered 2011-11-27: 2 mg via INTRAVENOUS
  Filled 2011-11-27: qty 1

## 2011-11-27 MED ORDER — HYDROMORPHONE HCL PF 1 MG/ML IJ SOLN
1.0000 mg | Freq: Once | INTRAMUSCULAR | Status: AC
  Administered 2011-11-27: 1 mg via INTRAVENOUS
  Filled 2011-11-27: qty 1

## 2011-11-27 MED ORDER — TAMSULOSIN HCL 0.4 MG PO CAPS: 0.4000 mg | ORAL_CAPSULE | Freq: Every day | ORAL | Status: DC

## 2011-11-27 MED ORDER — IBUPROFEN 800 MG PO TABS: 800.0000 mg | ORAL_TABLET | Freq: Three times a day (TID) | ORAL | Status: AC

## 2011-11-27 MED ORDER — SODIUM CHLORIDE 0.9 % IV BOLUS (SEPSIS)
1000.0000 mL | Freq: Once | INTRAVENOUS | Status: AC
  Administered 2011-11-27: 1000 mL via INTRAVENOUS

## 2011-11-27 MED ORDER — ONDANSETRON HCL 4 MG PO TABS: 4.0000 mg | ORAL_TABLET | Freq: Four times a day (QID) | ORAL | Status: AC | PRN

## 2011-11-27 MED ORDER — KETOROLAC TROMETHAMINE 30 MG/ML IJ SOLN
30.0000 mg | Freq: Once | INTRAMUSCULAR | Status: AC
  Administered 2011-11-27: 30 mg via INTRAVENOUS
  Filled 2011-11-27: qty 1

## 2011-11-27 MED ORDER — ONDANSETRON HCL 4 MG/2ML IJ SOLN
4.0000 mg | Freq: Once | INTRAMUSCULAR | Status: AC
  Administered 2011-11-27: 4 mg via INTRAVENOUS
  Filled 2011-11-27: qty 2

## 2011-11-27 MED ORDER — OXYCODONE-ACETAMINOPHEN 5-325 MG PO TABS
1.0000 | ORAL_TABLET | Freq: Four times a day (QID) | ORAL | Status: AC | PRN
Start: 2011-11-27 — End: 2011-12-07

## 2011-11-27 NOTE — ED Provider Notes (Signed)
Medical screening examination/treatment/procedure(s) were performed by non-physician practitioner and as supervising physician I was immediately available for consultation/collaboration. Devoria Albe, MD, Armando Gang   Ward Givens, MD 11/27/11 2013

## 2011-11-27 NOTE — ED Provider Notes (Signed)
History     CSN: [id number]  Arrival date & time 11/27/11  1604   First MD Initiated Contact with Patient 11/27/11 1606      Chief Complaint  Patient presents with  . Flank Pain    (Consider location/radiation/quality/duration/timing/severity/associated sxs/prior treatment) Patient is a 21 y.o. male presenting with flank pain. The history is provided by the patient.  Flank Pain This is a recurrent problem. The current episode started yesterday. The problem occurs intermittently. The problem has been unchanged. Associated symptoms include abdominal pain, nausea and vomiting.   left flank pain with radiation to the left lower abdomen. Feels the same as prior kidney stone. There is associated nausea and vomiting. Denies fever, chills, dysuria or gross hematuria. No aggravating or alleviating factors. No prior treatment attempted. Seen in the emergency department early last month with CT diagnosed kidney stones. Does not recall the name of his urologist.  Past Medical History  Diagnosis Date  . Kidney stones     Past Surgical History  Procedure Date  . Fracture surgery     No family history on file.  History  Substance Use Topics  . Smoking status: Current Everyday Smoker -- 0.5 packs/day    Types: Cigarettes  . Smokeless tobacco: Never Used  . Alcohol Use: Yes     occ      Review of Systems  Gastrointestinal: Positive for nausea, vomiting and abdominal pain.  Genitourinary: Positive for flank pain.  All other systems reviewed and are negative.    Allergies  Review of patient's allergies indicates no known allergies.  Home Medications   Current Outpatient Rx  Name Route Sig Dispense Refill  . HYDROCODONE-ACETAMINOPHEN 5-325 MG PO TABS Oral Take 1 tablet by mouth once.    . OMEPRAZOLE 20 MG PO CPDR Oral Take 20 mg by mouth daily as needed. For indigestion.    Marland Kitchen PROBIOTIC PO Oral Take 1 capsule by mouth daily.      BP 158/105  Pulse 79  Temp 98 F (36.7 C)  (Oral)  Resp 20  Ht 5\' 6"  (1.676 m)  Wt 183 lb (83.008 kg)  BMI 29.54 kg/m2  SpO2 100%  Physical Exam  Nursing note reviewed. Constitutional: He appears well-developed and well-nourished. He appears distressed.       VS reviewed, sig for HTN. Writhing around in pain on stretcher  HENT:  Head: Normocephalic and atraumatic.  Right Ear: External ear normal.  Left Ear: External ear normal.       MMM  Eyes: Conjunctivae are normal.  Neck: Neck supple.  Cardiovascular: Normal rate, regular rhythm and normal heart sounds.   Pulmonary/Chest: Effort normal and breath sounds normal. No respiratory distress. He has no wheezes.  Abdominal: Soft. Bowel sounds are normal. He exhibits no distension. There is no tenderness.       No CVAT  Musculoskeletal: He exhibits no edema and no tenderness.       Entire spine  Non-tender  Neurological: He is alert.       GCS 15  Skin: Skin is warm and dry. He is not diaphoretic.    ED Course  Procedures (including critical care time)  Labs Reviewed  CBC - Abnormal; Notable for the following:    WBC 11.5 (*)     All other components within normal limits  URINALYSIS, ROUTINE W REFLEX MICROSCOPIC - Abnormal; Notable for the following:    Hgb urine dipstick SMALL (*)     Ketones, ur TRACE (*)  All other components within normal limits  POCT I-STAT, CHEM 8  URINE MICROSCOPIC-ADD ON   No results found.   1. Flank pain   2. Kidney stone       MDM  Left flank pain, known kidney stones- CT 1 month ago showed several. +hematuria today. Renal function preserved. No UTI. Pain controlled enough that pt feels comfortable with d/c home rather than ED urology consult. Return precautions discussed. Rx given for flomax, zofran, percocet, ibuprofen.        Shaaron Adler, [place] 11/27/11 2012

## 2011-11-27 NOTE — ED Notes (Signed)
Per pt has history of kidney stones-passed stone one month ago

## 2011-12-15 ENCOUNTER — Ambulatory Visit (INDEPENDENT_AMBULATORY_CARE_PROVIDER_SITE_OTHER): Payer: Managed Care, Other (non HMO) | Admitting: Internal Medicine

## 2011-12-15 VITALS — BP 158/120 | HR 80 | Temp 98.5°F | Resp 16 | Ht 66.0 in | Wt 166.8 lb

## 2011-12-15 DIAGNOSIS — J329 Chronic sinusitis, unspecified: Secondary | ICD-10-CM

## 2011-12-15 DIAGNOSIS — R300 Dysuria: Secondary | ICD-10-CM

## 2011-12-15 DIAGNOSIS — R030 Elevated blood-pressure reading, without diagnosis of hypertension: Secondary | ICD-10-CM

## 2011-12-15 DIAGNOSIS — N200 Calculus of kidney: Secondary | ICD-10-CM

## 2011-12-15 DIAGNOSIS — IMO0001 Reserved for inherently not codable concepts without codable children: Secondary | ICD-10-CM

## 2011-12-15 DIAGNOSIS — R059 Cough, unspecified: Secondary | ICD-10-CM

## 2011-12-15 DIAGNOSIS — R05 Cough: Secondary | ICD-10-CM

## 2011-12-15 DIAGNOSIS — R350 Frequency of micturition: Secondary | ICD-10-CM

## 2011-12-15 LAB — POCT UA - MICROSCOPIC ONLY
Bacteria, U Microscopic: NEGATIVE
Casts, Ur, LPF, POC: NEGATIVE
Crystals, Ur, HPF, POC: NEGATIVE
Epithelial cells, urine per micros: NEGATIVE
Mucus, UA: NEGATIVE

## 2011-12-15 LAB — POCT URINALYSIS DIPSTICK
Glucose, UA: NEGATIVE
Protein, UA: NEGATIVE
Spec Grav, UA: 1.02
Urobilinogen, UA: 0.2
pH, UA: 7

## 2011-12-15 MED ORDER — CIPROFLOXACIN HCL 500 MG PO TABS: 500.0000 mg | ORAL_TABLET | Freq: Two times a day (BID) | ORAL | Status: AC

## 2011-12-15 NOTE — Progress Notes (Signed)
  Subjective:    Patient ID: Cameron Bradshaw, male    DOB: Jan 15, 1991, 21 y.o.   MRN: [id number]  HPIComplaining of dysuria, frequency and urgency for 48 hours/Also with left flank and left lower quadrant pain As he was collecting a urine specimen he passed a 6 mm stone He has a history of recurrent stones over the last 2 years and in particular 3 emergency room visits in the last 2 months plus urology evaluation  No penile discharge or fever  Also complaining of a bad cough for 2 weeks with sinus congestion and postnasal drainage Cough hurts but is not productive although he had a few episodes of bloody sputum No shortness of breath or dyspnea on exertion Has a history of the diagnosis of chronic bronchitis at age 51 but it sounds more like he was having reactive airway disease from frequent infections and allergies No night sweats    Review of Systems No fatigue/has lost 15 pounds since March 2013 without trying but with multiple episodes of stones with nausea and poor intake No abdominal pain No testicular pain No chest pain or palpitations/not aware of any family history of hypertension/Note blood pressures in the chart from ER visits all elevated/He has a history Of blood pressure elevation and doctor's visits/diet is all fast foods/little exercise/works often    Objective:   Physical Exam No acute distress Filed Vitals:   12/15/11 1357  BP: 158/120  Pulse: 80  Temp: 98.5 F (36.9 C)  Resp: 16     HEENT=TMs clear/nares boggy with purulent discharge/throat with postnasal drainage Lungs with rhonchi that clear with coughing Heart regular without murmur Abdomen benign No CVA tenderness to percussion      Results for orders placed in visit on 12/15/11  POCT UA - MICROSCOPIC ONLY      Component Value Range   WBC, Ur, HPF, POC 0-5     RBC, urine, microscopic 0-3     Bacteria, U Microscopic neg     Mucus, UA neg     Epithelial cells, urine per micros neg     Crystals, Ur, HPF, POC neg     Casts, Ur, LPF, POC neg     Yeast, UA neg    POCT URINALYSIS DIPSTICK      Component Value Range   Color, UA yellow     Clarity, UA clear     Glucose, UA neg     Bilirubin, UA neg     Ketones, UA neg     Spec Grav, UA 1.020     Blood, UA trace-lysed     pH, UA 7.0     Protein, UA neg     Urobilinogen, UA 0.2     Nitrite, UA positive     Leukocytes, UA Negative      Assessment & Plan:   1. Urinary frequency    2. Dysuria    3. Recurrent nephrolithiasis    4. Cough    5. Sinusitis    6. Blood pressure elevated     Urine culture Labs from emergency room reviewed with normal BUN creatinine Stone sent for [employer] Cipro 500 twice a day #20 He has cough medicine He will need a diet for stone prevention and possibly further urological workup Low-salt diet/avoid fast foods/lose 10 pounds more/recheck blood pressure in 3 months to 6 months

## 2011-12-17 LAB — URINE CULTURE: Organism ID, Bacteria: NO GROWTH

## 2011-12-19 LAB — STONE ANALYSIS

## 2014-09-17 ENCOUNTER — Emergency Department (HOSPITAL_COMMUNITY): Payer: 59

## 2014-09-17 ENCOUNTER — Emergency Department (HOSPITAL_COMMUNITY)
Admission: EM | Admit: 2014-09-17 | Discharge: 2014-09-17 | Disposition: A | Discharge status: Home / Self Care | Payer: 59 | Attending: Emergency Medicine | Admitting: Emergency Medicine

## 2014-09-17 ENCOUNTER — Encounter (HOSPITAL_COMMUNITY): Payer: Self-pay | Admitting: Emergency Medicine

## 2014-09-17 DIAGNOSIS — Z720 Tobacco use: Secondary | ICD-10-CM | POA: Insufficient documentation

## 2014-09-17 DIAGNOSIS — M545 Low back pain, unspecified: Secondary | ICD-10-CM

## 2014-09-17 DIAGNOSIS — M546 Pain in thoracic spine: Secondary | ICD-10-CM | POA: Diagnosis not present

## 2014-09-17 DIAGNOSIS — Z79899 Other long term (current) drug therapy: Secondary | ICD-10-CM | POA: Insufficient documentation

## 2014-09-17 DIAGNOSIS — R05 Cough: Secondary | ICD-10-CM | POA: Insufficient documentation

## 2014-09-17 DIAGNOSIS — R059 Cough, unspecified: Secondary | ICD-10-CM

## 2014-09-17 DIAGNOSIS — Z87442 Personal history of urinary calculi: Secondary | ICD-10-CM | POA: Insufficient documentation

## 2014-09-17 MED ORDER — HYDROMORPHONE HCL 1 MG/ML IJ SOLN
1.0000 mg | Freq: Once | INTRAMUSCULAR | Status: DC
  Filled 2014-09-17: qty 1

## 2014-09-17 MED ORDER — HYDROCOD POLST-CPM POLST ER 10-8 MG/5ML PO SUER: 5.0000 mL | Freq: Two times a day (BID) | ORAL | Status: DC | PRN

## 2014-09-17 MED ORDER — IBUPROFEN 800 MG PO TABS: 800.0000 mg | ORAL_TABLET | Freq: Three times a day (TID) | ORAL | Status: DC

## 2014-09-17 MED ORDER — CYCLOBENZAPRINE HCL 10 MG PO TABS: 10.0000 mg | ORAL_TABLET | Freq: Two times a day (BID) | ORAL | Status: DC | PRN

## 2014-09-17 MED ORDER — KETOROLAC TROMETHAMINE 60 MG/2ML IM SOLN
60.0000 mg | Freq: Once | INTRAMUSCULAR | Status: AC
  Administered 2014-09-17: 60 mg via INTRAMUSCULAR
  Filled 2014-09-17: qty 2

## 2014-09-17 MED ORDER — DIAZEPAM 5 MG PO TABS
5.0000 mg | ORAL_TABLET | Freq: Once | ORAL | Status: AC
  Administered 2014-09-17: 5 mg via ORAL
  Filled 2014-09-17: qty 1

## 2014-09-17 MED ORDER — OXYCODONE-ACETAMINOPHEN 5-325 MG PO TABS
2.0000 | ORAL_TABLET | Freq: Once | ORAL | Status: AC
  Administered 2014-09-17: 2 via ORAL
  Filled 2014-09-17: qty 2

## 2014-09-17 MED ORDER — HYDROMORPHONE HCL 1 MG/ML IJ SOLN
1.0000 mg | Freq: Once | INTRAMUSCULAR | Status: AC
  Administered 2014-09-17: 1 mg via INTRAMUSCULAR

## 2014-09-17 NOTE — ED Notes (Addendum)
Pt reports severe, crippling low back pain x 90 minutes. Pain does not radiate to legs. Denies trauma. Pt reports shortness of breath when the "spasms hits". Pt is able to ambulate with difficulty Pt stated that a coughing spell may have caused the pain to start. Pt stated that he is only hypertensive when in pain

## 2014-09-17 NOTE — Discharge Instructions (Signed)
Back Pain, Adult °Low back pain is very common. About 1 in 5 people have back pain. The cause of low back pain is rarely dangerous. The pain often gets better over time. About half of people with a sudden onset of back pain feel better in just 2 weeks. About 8 in 10 people feel better by 6 weeks.  °CAUSES °Some common causes of back pain include: °· Strain of the muscles or ligaments supporting the spine. °· Wear and tear (degeneration) of the spinal discs. °· Arthritis. °· Direct injury to the back. °DIAGNOSIS °Most of the time, the direct cause of low back pain is not known. However, back pain can be treated effectively even when the exact cause of the pain is unknown. Answering your caregiver's questions about your overall health and symptoms is one of the most accurate ways to make sure the cause of your pain is not dangerous. If your caregiver needs more information, he or she may order lab work or imaging tests (X-rays or MRIs). However, even if imaging tests show changes in your back, this usually does not require surgery. °HOME CARE INSTRUCTIONS °For many people, back pain returns. Since low back pain is rarely dangerous, it is often a condition that people can learn to manage on their own.  °· Remain active. It is stressful on the back to sit or stand in one place. Do not sit, drive, or stand in one place for more than 30 minutes at a time. Take short walks on level surfaces as soon as pain allows. Try to increase the length of time you walk each day. °· Do not stay in bed. Resting more than 1 or 2 days can delay your recovery. °· Do not avoid exercise or work. Your body is made to move. It is not dangerous to be active, even though your back may hurt. Your back will likely heal faster if you return to being active before your pain is gone. °· Pay attention to your body when you  bend and lift. Many people have less discomfort when lifting if they bend their knees, keep the load close to their bodies, and  avoid twisting. Often, the most comfortable positions are those that put less stress on your recovering back. °· Find a comfortable position to sleep. Use a firm mattress and lie on your side with your knees slightly bent. If you lie on your back, put a pillow under your knees. °· Only take over-the-counter or prescription medicines as directed by your caregiver. Over-the-counter medicines to reduce pain and inflammation are often the most helpful. Your caregiver may prescribe muscle relaxant drugs. These medicines help dull your pain so you can more quickly return to your normal activities and healthy exercise. °· Put ice on the injured area. °¨ Put ice in a plastic bag. °¨ Place a towel between your skin and the bag. °¨ Leave the ice on for 15-20 minutes, 03-04 times a day for the first 2 to 3 days. After that, ice and heat may be alternated to reduce pain and spasms. °· Ask your caregiver about trying back exercises and gentle massage. This may be of some benefit. °· Avoid feeling anxious or stressed. Stress increases muscle tension and can worsen back pain. It is important to recognize when you are anxious or stressed and learn ways to manage it. Exercise is a great option. °SEEK MEDICAL CARE IF: °· You have pain that is not relieved with rest or medicine. °· You have pain that does not improve in 1 week. °· You have new symptoms. °· You are generally not feeling well. °SEEK   IMMEDIATE MEDICAL CARE IF:  °· You have pain that radiates from your back into your legs. °· You develop new bowel or bladder control problems. °· You have unusual weakness or numbness in your arms or legs. °· You develop nausea or vomiting. °· You develop abdominal pain. °· You feel faint. °Document Released: 04/29/2005 Document Revised: 10/29/2011 Document Reviewed: 08/31/2013 °ExitCare® Patient Information ©2015 [employer]. This information is not intended to replace advice given to you by your health care provider. Make sure you  discuss any questions you have with your health care provider. ° ° °Back Exercises °Back exercises help treat and prevent back injuries. The goal of back exercises is to increase the strength of your abdominal and back muscles and the flexibility of your back. These exercises should be started when you no longer have back pain. Back exercises include: °· Pelvic Tilt. Lie on your back with your knees bent. Tilt your pelvis until the lower part of your back is against the floor. Hold this position 5 to 10 sec and repeat 5 to 10 times. °· Knee to Chest. Pull first 1 knee up against your chest and hold for 20 to 30 seconds, repeat this with the other knee, and then both knees. This may be done with the other leg straight or bent, whichever feels better. °· Sit-Ups or Curl-Ups. Bend your knees 90 degrees. Start with tilting your pelvis, and do a partial, slow sit-up, lifting your trunk only 30 to 45 degrees off the floor. Take at least 2 to 3 seconds for each sit-up. Do not do sit-ups with your knees out straight. If partial sit-ups are difficult, simply do the above but with only tightening your abdominal muscles and holding it as directed. °· Hip-Lift. Lie on your back with your knees flexed 90 degrees. Push down with your feet and shoulders as you raise your hips a couple inches off the floor; hold for 10 seconds, repeat 5 to 10 times. °· Back arches. Lie on your stomach, propping yourself up on bent elbows. Slowly press on your hands, causing an arch in your low back. Repeat 3 to 5 times. Any initial stiffness and discomfort should lessen with repetition over time. °· Shoulder-Lifts. Lie face down with arms beside your body. Keep hips and torso pressed to floor as you slowly lift your head and shoulders off the floor. °Do not overdo your exercises, especially in the beginning. Exercises may cause you some mild back discomfort which lasts for a few minutes; however, if the pain is more severe, or lasts for more than  15 minutes, do not continue exercises until you see your caregiver. Improvement with exercise therapy for back problems is slow.  °See your caregivers for assistance with developing a proper back exercise program. °Document Released: 06/06/2004 Document Revised: 07/22/2011 Document Reviewed: 02/28/2011 °ExitCare® Patient Information ©2015 [employer]. This information is not intended to replace advice given to you by your health care provider. Make sure you discuss any questions you have with your health care provider. ° ° °Emergency Department Resource Guide °1) Find a Doctor and Pay Out of Pocket °Although you won't have to find out who is covered by your insurance plan, it is a good idea to ask around and get recommendations. You will then need to call the office and see if the doctor you have chosen will accept you as a new patient and what types of options they offer for patients who are self-pay. Some doctors offer discounts or   will set up payment plans for their patients who do not have insurance, but you will need to ask so you aren't surprised when you get to your appointment. ° °2) Contact Your Local Health Department °Not all health departments have doctors that can see patients for sick visits, but many do, so it is worth a call to see if yours does. If you don't know where your local health department is, you can check in your phone book. The CDC also has a tool to help you locate your state's health department, and many state websites also have listings of all of their local health departments. ° °3) Find a Walk-in Clinic °If your illness is not likely to be very severe or complicated, you may want to try a walk in clinic. These are popping up all over the country in pharmacies, drugstores, and shopping centers. They're usually staffed by nurse practitioners or physician assistants that have been trained to treat common illnesses and complaints. They're usually fairly quick and inexpensive. However,  if you have serious medical issues or chronic medical problems, these are probably not your best option. ° °No Primary Care Doctor: °- Call [facility] at  832-8000 - they can help you locate a primary care doctor that  accepts your insurance, provides certain services, etc. °- Physician Referral Service- 1-800-533-3463 ° °Chronic Pain Problems: °Organization         Address  Phone   Notes  °[facility]  (336) 297-2271 Patients need to be referred by their primary care doctor.  ° °Medication Assistance: °Organization         Address  Phone   Notes  °[place] [facility] [address] °[place], [place] [address] (336) 641-8030 --Must be a resident of [place] °-- Must have NO insurance coverage whatsoever (no Medicaid/ Medicare, etc.) °-- The pt. MUST have a primary care doctor that directs their care regularly and follows them in the community °  °[facility]  (866) 331-1348   °[employer]  (888) 892-1162   ° °Agencies that provide inexpensive medical care: °Organization         Address  Phone   Notes  °[facility]  (336) 832-8035   °[facility]    (336) 832-7272   °[facility] [address] °[place], [place] [address] (336) 832-4777   °[facility] [address], °[place] (336) 271-4999   °[facility]    (336) 373-0678   °[facility]    (336) 272-1050   °[facility] ° [address], [place] Phone:  (336) 832-4444, Fax:  (336) 832-4440 Hours of Operation:  9 am - 6 pm, M-F.  Also accepts Medicaid/Medicare and self-pay.  °[facility] ° [address], [place] Phone: (336) 832-3150, Fax: (336) [phone]. Hours of Operation:  8:30 am - 5:30 pm, M-F.  Also accepts Medicaid and self-pay.  °[facility] [place] [address], [place] Phone: (336) 878-6027   °[facility] [address], [place], [place] (336)723-1848, Ext. 123 Mondays & Thursdays: 7-9 AM.  First 15 patients are seen on a first come, first serve basis. °  ° °Medicaid-accepting [place] Providers: ° °Organization         Address  Phone   Notes  °[facility] [address], [place] (336)   641-2100 Also accepts self-pay patients.  °[facility] [address], [place] ° (336) 856-9996   °[facility] [address] [place] [address], [place] (336) 288-8857   °[facility] [address] [place] [address], [place] (336) 299-7000   °Veita Bland [address], [place]  ° (336) 373-1557 Only accepts [place] Access Medicaid patients after they have their name applied to their card.  ° °Self-Pay (no insurance) in [place]: ° °Organization         Address  Phone   Notes  °Sickle Cell Patients, [facility] [address], [place] (336) 832-1970   °[facility] [address], [place] (336) 832-4400   °[facility] ° [address], [place] (336) 992-4800   °[facility]/Dr. Osei-Bonsu ° [address] [place] [address], [place] or [address], [place] (336) [phone] Phone number for both [place] and [place] locations is the same.  °[facility] [address], [place] (336) 299-0000   °[facility] [address] [place] [address], [place] or [address] (336) 852-7530 °(336) 878-2260   °[facility] [address], [place] (336) 350-1642, phone; (336) 294-5005, fax Sees patients 1st and 3rd Saturday of every month.  Must not qualify for public or private insurance (i.e. Medicaid, Medicare, [place] Health Choice, Veterans' Benefits) • Household income should be no more than 200% of the poverty level •The clinic cannot treat you if you are pregnant or think you are  pregnant • Sexually transmitted diseases are not treated at the clinic.  ° ° °Dental Care: °Organization         Address  Phone  Notes  °[facility] [address], [place] (336) 641-6152 Accepts children up to age 21 who are enrolled in Medicaid or [place] Health Choice; pregnant women with a Medicaid card; and children who have applied for Medicaid or [place] Health Choice, but were declined, whose parents can pay a reduced fee at time of service.  °[facility] [place]  [address], [place] (336) 641-7733 Accepts children up to age 21 who are enrolled in Medicaid or [place] Health Choice; pregnant women with a Medicaid card; and children who have applied for Medicaid or [place] Health Choice, but were declined, whose parents can pay a reduced fee at time of service.  °[facility] ° [address],  (336) 641-4533 Patients are seen by appointment only. Walk-ins are not accepted. [facility] will see patients 18 years of age and older. °Monday - Tuesday (8am-5pm) °Most Wednesdays (8:30-5pm) °$30 per visit, cash only  °[facility] ° [address], [place] (336) 641-4533 Patients are seen by appointment only. Walk-ins are not accepted. [facility] will see patients 18 years of age and older. °One Wednesday Evening (Monthly: Volunteer Based).  $30 per visit, cash only  °[facility]  (919) 537-3737 for adults; Children under age 4, call Graduate Pediatric Dentistry at ([phone]) [phone]. Children aged 4-14, please call ([phone]) [phone] to request a pediatric application. ° Dental services are provided in all areas of dental care including fillings, crowns and bridges, complete and partial dentures, implants, gum treatment, root canals, and extractions. Preventive care is also provided. Treatment   is provided to both adults and  children. °Patients are selected via a lottery and there is often a waiting list. °  °[facility] [address], °[place] ° (336) 763-8833 [email or web address] °  °[facility] [address], [place], [place] (336)723-1848, Ext. 123 Second and Fourth Thursday of each month, opens at 6:30 AM; Clinic ends at 9 AM.  Patients are seen on a first-come first-served basis, and a limited number are seen during each clinic.  ° °[facility] ° [address], [place], [place] (336) 723-7904   Eligibility Requirements °You must have lived in [place], [place], or [place] counties for at least the last three months. °  You cannot be eligible for state or federal sponsored healthcare insurance, including Veterans Administration, Medicaid, or Medicare. °  You generally cannot be eligible for healthcare insurance through your employer.  °  How to apply: °Eligibility screenings are held every Tuesday and Wednesday afternoon from 1:00 pm until 4:00 pm. You do not need an appointment for the interview!  °[facility] [address], [place], [place] 336-631-2330   °[facility]  336-342-8273   °[facility]  336-703-3100   °[facility]  336-570-6415   ° °Behavioral Health Resources in the Community: °Intensive Outpatient Programs °Organization         Address  Phone  Notes  °[facility] [address], [place], [place] 336-878-6098   °[facility] [address], [place], [place] 336-832-9800   °[facility] [address], [place], [place] ° 336-882-2125   °[facility] [address],  °[place], [place] 1-800-853-5163 or 336-641-4981   °Substance Abuse Resources °Organization         Address  Phone  Notes  °[facility]  336-882-2125   °[facility]  336-784-9470   °[facility]  336-285-9073    °[facility]  336-845-3988   °Residential & Outpatient Substance Abuse Program  1-800-659-3381   °Psychological Services °Organization         Address  Phone  Notes  ° Health  336- 832-9600   °[facility]  336- 378-7881   °[facility] [address], [place] 1-800-853-5163 or 336-641-4981   ° °Mobile Crisis Teams °Organization         Address  Phone  Notes  °[facility], Mobile Crisis Care Unit  1-877-626-1772   °Assertive °[facility] ° [address] [place], [place] 336-834-9664   °Sharon DeEsch [address] °[place] 336-554-5454   ° °Self-Help/Support Groups °Organization         Address  Phone             Notes  °[facility] - variety of support groups  336- 373-1402 Call for more information  °Narcotics Anonymous (NA), [facility] [address], °[place]  2 meetings at this location  ° °Residential Treatment Programs °Organization         Address  Phone  Notes  °[facility] [address],    °[place]  1-866-801-8205   °[facility] ° [address], [place], [place] 704-293-8524   °[facility] [address], [place] 336-845-3988 Admissions: 8am-3pm M-F  °[facility] [address]    °[place],   [place] 336-841-1104   °[facility] [address], [place], [place] 336-379-7146   °[facility] [address]  °[place], [place] 336-285-9073   °[facility] Intensive Outpatient [address], [place], [place] 336-852-3033   °[facility] [address]  °[place], [place] 1-877-615-2722 or 336-784-9470   °[facility] [address] , [place] 336-227-7417 Accepts Medicaid  °[facility] [address]  °[place] 1-800-659-3381 Substance Abuse/Addiction Treatment  ° °[place]  Behavioral Health Resources °Organization         Address  Phone  Notes  °[facility]  (888) 581-9988   °Julie Brannon, PhD [address] [place], [place]   (336) 349-5553 or (336) 951-0000   °[facility]   [address] °[place], [place] (336) 349-4454   °[facility] [address], [place], [place] (336) 342-8316 Insurance/Medicaid/sponsorship through [facility]  °[facility] [address]                                    [place], [place] (336) 342-8316 Therapy/tele-psych/case  °[facility] [address]  ° [place], [place] (336) 349-2233    °Dr. Arfeen  (336) 349-4544   °[facility] [place]  [employer] [place] [facility] 1) [address], [place] °2) [address], [place] °3)  [address], [place] (336) 349-3220 °(336) 342-7768 ° °(336) 342-8140   °[place] Child Abuse [facility] (336) 342-1394 or (336) 342-3537 (After Hours)    ° ° ° °

## 2014-09-17 NOTE — ED Provider Notes (Signed)
CSN: [id number]     Arrival date & time 09/17/14  1414 History  This chart was scribed for non-physician practitioner, Ladona Mow, PA-C,working with Eber Hong, MD, by Karle Plumber, ED Scribe. This patient was seen in room WTR7/WTR7 and the patient's care was started at 2:40 PM.  Chief Complaint  Patient presents with  . Back Pain    2 hr hx of acute back pain   The history is provided by the patient and medical records. No language interpreter was used.    HPI Comments:  Cameron Bradshaw is a 24 y.o. male with PMHx of kidney stones who presents to the Emergency Department complaining of severe, "crippling", non-radiating low back pain that began approximately 1.5 hours ago. He states he was using the bathroom and began coughing when the pain worsened. He states he woke up with pain all over his body. He states the pain is different from kidney stone pain. He states he coughs a lot normally but the coughing has never caused this type of pain before. He reports breathing or moving makes the pain worse. Denies alleviating factors. He has not done anything to treat the pain. Denies numbness, tingling or weakness to the lower extremities, saddle anesthesia, fever, chills, nausea, vomiting, bowel or bladder incontinence. Denies trauma, injury or fall.  Past Medical History  Diagnosis Date  . Kidney stones    Past Surgical History  Procedure Laterality Date  . Fracture surgery     History reviewed. No pertinent family history. History  Substance Use Topics  . Smoking status: Current Every Day Smoker -- 0.50 packs/day    Types: Cigarettes  . Smokeless tobacco: Never Used  . Alcohol Use: Yes     Comment: occ    Review of Systems  Constitutional: Negative for fever and chills.  Respiratory: Positive for cough (normal at baseline).   Gastrointestinal: Negative for nausea and vomiting.  Genitourinary:       No saddle anesthesia. No bowel or bladder incontinence.  Musculoskeletal:  Positive for back pain.  Skin: Negative for color change and wound.  Neurological: Negative for syncope, weakness and numbness.    Allergies  Review of patient's allergies indicates no known allergies.  Home Medications   Prior to Admission medications   Medication Sig Start Date End Date Taking? Authorizing Provider  omeprazole (PRILOSEC) 20 MG capsule Take 20 mg by mouth daily as needed (heartburn). For indigestion.   Yes Historical Provider, MD  chlorpheniramine-HYDROcodone (TUSSIONEX PENNKINETIC ER) 10-8 MG/5ML SUER Take 5 mLs by mouth every 12 (twelve) hours as needed for cough. 09/17/14   Ladona Mow, PA-C  cyclobenzaprine (FLEXERIL) 10 MG tablet Take 1 tablet (10 mg total) by mouth 2 (two) times daily as needed for muscle spasms. 09/17/14   Ladona Mow, PA-C  ibuprofen (ADVIL,MOTRIN) 800 MG tablet Take 1 tablet (800 mg total) by mouth 3 (three) times daily. 09/17/14   Ladona Mow, PA-C  Tamsulosin HCl (FLOMAX) 0.4 MG CAPS Take 1 capsule (0.4 mg total) by mouth daily. Patient not taking: Reported on 09/17/2014 11/27/11   Lorenz Coaster, PA-C   Triage Vitals: BP 170/116 mmHg  Pulse 88  Temp(Src) 98.4 F (36.9 C) (Oral)  Resp 20  Wt 170 lb (77.111 kg)  SpO2 96% Physical Exam  Constitutional: He is oriented to person, place, and time. He appears well-developed and well-nourished.  HENT:  Head: Normocephalic and atraumatic.  Eyes: EOM are normal.  Neck: Normal range of motion.  Cardiovascular: Normal rate, regular rhythm  and normal heart sounds.  Exam reveals no gallop and no friction rub.   No murmur heard. Pulmonary/Chest: Effort normal and breath sounds normal. No respiratory distress. He has no wheezes. He has no rales.  Musculoskeletal: Normal range of motion. He exhibits tenderness.  Lateral L1-L2 tenderness in bilateral lumbar region and reproducible pain with ROM in CVA/flank area. Mild paraspinous tenderness to T-10 region.  Neurological: He is alert and oriented to person, place,  and time. He has normal strength. No cranial nerve deficit or sensory deficit. He displays a negative Romberg sign. Coordination and gait normal. GCS eye subscore is 4. GCS verbal subscore is 5. GCS motor subscore is 6.  Reflex Scores:      Patellar reflexes are 2+ on the right side and 2+ on the left side.      Achilles reflexes are 2+ on the right side and 2+ on the left side. Patient fully alert, answering questions appropriately in full, clear sentences. Cranial nerves II through XII grossly intact. Motor strength 5 out of 5 in all major muscle groups of upper and lower extremities. Distal sensation intact.  Skin: Skin is warm and dry.  Psychiatric: He has a normal mood and affect. His behavior is normal.  Nursing note and vitals reviewed.   ED Course  Procedures (including critical care time) DIAGNOSTIC STUDIES: Oxygen Saturation is 96% on RA, normal by my interpretation.   COORDINATION OF CARE: 2:49 PM- Will order pain medication and re-evaluate patient. Pt verbalizes understanding and agrees to plan.  Medications  oxyCODONE-acetaminophen (PERCOCET/ROXICET) 5-325 MG per tablet 2 tablet (2 tablets Oral Given 09/17/14 1501)  ketorolac (TORADOL) injection 60 mg (60 mg Intramuscular Given 09/17/14 1501)  diazepam (VALIUM) tablet 5 mg (5 mg Oral Given 09/17/14 1502)  HYDROmorphone (DILAUDID) injection 1 mg (1 mg Intramuscular Given 09/17/14 1637)    Labs Review Labs Reviewed - No data to display  Imaging Review Dg Chest 2 View  09/17/2014   CLINICAL DATA:  Lower back pain on both sides and posterior aspect of lower back with no known injury today; pt states that the pain just started all the sudden and it is getting worse; pain is not radiating, no previous injury; pt states that he gets SOB due to pain; no known cardiopulmonary problems; smoker 0.5 ppd x 3-4 years  EXAM: CHEST  2 VIEW  COMPARISON:  None.  FINDINGS: Cardiac silhouette normal in size and configuration. Normal mediastinal and  hilar contours. Clear lungs. No pleural effusion or pneumothorax.  Skeletal structures are unremarkable.  IMPRESSION: Normal chest radiographs.   Electronically Signed   By: Amie Portlandavid  Ormond M.D.   On: 09/17/2014 15:38   Dg Thoracic Spine 2 View  09/17/2014   CLINICAL DATA:  Lower back pain on both sides and posterior aspect of lower back with no known injury today; pt states that the pain just started all the sudden and it is getting worse; pain is not radiating, no previous injury; pt states that he gets SOB due to pain; no known cardiopulmonary problems; smoker 0.5 ppd x 3-4 years  EXAM: THORACIC SPINE - 2 VIEW  COMPARISON:  None.  FINDINGS: There is no evidence of thoracic spine fracture. Alignment is normal. No other significant bone abnormalities are identified.  IMPRESSION: Negative.   Electronically Signed   By: Amie Portlandavid  Ormond M.D.   On: 09/17/2014 15:40   Dg Lumbar Spine Complete  09/17/2014   CLINICAL DATA:  Lower back pain on both sides and posterior  aspect of lower back with no known injury today; pt states that the pain just started all the sudden and it is getting worse; pain is not radiating, no previous injury; pt states that he gets SOB due to pain; no known cardiopulmonary problems; smoker 0.5 ppd x 3-4 years  EXAM: LUMBAR SPINE - COMPLETE 4+ VIEW  COMPARISON:  None.  FINDINGS: There is no evidence of lumbar spine fracture. Alignment is normal. Intervertebral disc spaces are maintained.  IMPRESSION: Negative.   Electronically Signed   By: Amie Portlandavid  Ormond M.D.   On: 09/17/2014 15:39     EKG Interpretation None      MDM   Final diagnoses:  Cough  Midline thoracic back pain  Bilateral low back pain without sciatica    Patient with back pain.  Symptoms improved with symptomatic therapy here. No neurological deficits and normal neuro exam.  Patient can walk but states is painful.  No loss of bowel or bladder control.  No concern for cauda equina.  No fever, night sweats, weight loss, h/o  cancer, IVDU.  RICE protocol and pain medicine indicated and discussed with patient. Discussed return precautions with patient, patient verbalizes understanding and agreement of this plan.  I personally performed the services described in this documentation, which was scribed in my presence. The recorded information has been reviewed and is accurate.  BP 152/110 mmHg  Pulse 83  Temp(Src) 98.4 F (36.9 C) (Oral)  Resp 20  Wt 170 lb (77.111 kg)  SpO2 96%  Signed,  Ladona MowJoe Kiera Hussey, PA-C 9:50 PM  Patient seen and discussed with Dr. Pricilla LovelessScott Goldston, M.D.  Ladona MowJoe Aashika Carta, PA-C 09/17/14 2150  Pricilla LovelessScott Goldston, MD 09/20/14 [phone]

## 2015-02-01 DIAGNOSIS — J309 Allergic rhinitis, unspecified: Secondary | ICD-10-CM | POA: Insufficient documentation

## 2015-02-01 DIAGNOSIS — J45909 Unspecified asthma, uncomplicated: Secondary | ICD-10-CM | POA: Insufficient documentation

## 2015-02-01 DIAGNOSIS — Z720 Tobacco use: Secondary | ICD-10-CM | POA: Insufficient documentation

## 2015-02-01 DIAGNOSIS — Z8719 Personal history of other diseases of the digestive system: Secondary | ICD-10-CM

## 2015-06-21 ENCOUNTER — Other Ambulatory Visit: Payer: Self-pay

## 2015-06-21 MED ORDER — OMEPRAZOLE 20 MG PO CPDR: 20.0000 mg | DELAYED_RELEASE_CAPSULE | Freq: Two times a day (BID) | ORAL | Status: DC

## 2015-09-18 ENCOUNTER — Encounter: Payer: Self-pay | Admitting: Allergy and Immunology

## 2015-09-18 ENCOUNTER — Ambulatory Visit (INDEPENDENT_AMBULATORY_CARE_PROVIDER_SITE_OTHER): Payer: Managed Care, Other (non HMO) | Admitting: Allergy and Immunology

## 2015-09-18 VITALS — BP 150/98 | HR 88 | Resp 18

## 2015-09-18 DIAGNOSIS — Z720 Tobacco use: Secondary | ICD-10-CM | POA: Diagnosis not present

## 2015-09-18 DIAGNOSIS — I10 Essential (primary) hypertension: Secondary | ICD-10-CM | POA: Diagnosis not present

## 2015-09-18 DIAGNOSIS — J4520 Mild intermittent asthma, uncomplicated: Secondary | ICD-10-CM | POA: Diagnosis not present

## 2015-09-18 DIAGNOSIS — K219 Gastro-esophageal reflux disease without esophagitis: Secondary | ICD-10-CM

## 2015-09-18 DIAGNOSIS — H1010 Acute atopic conjunctivitis, unspecified eye: Secondary | ICD-10-CM | POA: Diagnosis not present

## 2015-09-18 DIAGNOSIS — J309 Allergic rhinitis, unspecified: Secondary | ICD-10-CM

## 2015-09-18 MED ORDER — ALBUTEROL SULFATE HFA 108 (90 BASE) MCG/ACT IN AERS: 2.0000 | INHALATION_SPRAY | RESPIRATORY_TRACT | Status: DC | PRN

## 2015-09-18 MED ORDER — AMLODIPINE BESYLATE 5 MG PO TABS: 5.0000 mg | ORAL_TABLET | Freq: Every day | ORAL | Status: DC

## 2015-09-18 NOTE — Progress Notes (Signed)
Follow-up Note  Referring Provider: No ref. provider found Primary Provider: No PCP Per Patient Date of Office Visit: 09/18/2015  Subjective:   Cameron Bradshaw (DOB: [birth year]) is a 25 y.o. male who [facility] in re-evaluation of the following:  HPI: Cameron Bradshaw presents this clinic in reevaluation of his mild intermittent asthma, allergic rhinoconjunctivitis, GERD, tobacco abuse, and systemic arterial hypertension.  His asthma has basically been nonexistent and he has not used a short-acting bronchodilator in over 6 months. He apparently did have an episode of "bronchitis" this fall for which he might of been given an antibiotic but even then he rarely used his short-acting bronchodilator. He does not exercise to any degree. He smokes approximately 7 cigarettes per day.  He believes that his nose has been doing quite well at this point in time with very little issues. He's not had any episodes of sinusitis. He uses Flonase on occasion.  His stomach is doing quite well while using omeprazole on a pretty regular basis.  His blood pressure is still elevated and he wants this addressed. I had a discussion with him multiple times in the past about this issue but he has never seen a primary care doctor. He does not exercise, he believes that he is overweight, and he smokes on a daily basis.    Medication List           albuterol 108 (90 Base) MCG/ACT inhaler  Commonly known as:  PROAIR HFA  Inhale 2 puffs into the lungs every 4 (four) hours as needed for wheezing or shortness of breath. Reported on 09/18/2015     ibuprofen 800 MG tablet  Commonly known as:  ADVIL,MOTRIN  Take 1 tablet (800 mg total) by mouth 3 (three) times daily.     omeprazole 20 MG capsule  Commonly known as:  PRILOSEC  Take 1 capsule (20 mg total) by mouth 2 (two) times daily. For indigestion.        Past Medical History  Diagnosis Date  . Kidney stones   .  Asthma     Past Surgical History  Procedure Laterality Date  . Fracture surgery      No Known Allergies  Review of systems negative except as noted in HPI / PMHx or noted below:  Review of Systems  Constitutional: Negative.   HENT: Negative.   Eyes: Negative.   Respiratory: Negative.   Cardiovascular: Negative.   Gastrointestinal: Negative.   Genitourinary: Negative.   Musculoskeletal: Negative.   Skin: Negative.   Neurological: Negative.   Endo/Heme/Allergies: Negative.   Psychiatric/Behavioral: Negative.      Objective:   Filed Vitals:   09/18/15 1338  BP: 150/98  Pulse: 88  Resp: 18          Physical Exam  Constitutional: He is well-developed, well-nourished, and in no distress.  HENT:  Head: Normocephalic.  Right Ear: Tympanic membrane, external ear and ear canal normal.  Left Ear: Tympanic membrane, external ear and ear canal normal.  Nose: Nose normal. No mucosal edema or rhinorrhea.  Mouth/Throat: Uvula is midline, oropharynx is clear and moist and mucous membranes are normal. No oropharyngeal exudate.  Eyes: Conjunctivae are normal.  Neck: Trachea normal. No tracheal tenderness present. No tracheal deviation present. No thyromegaly present.  Cardiovascular: Normal rate, regular rhythm, S1 normal, S2 normal and normal heart sounds.   No murmur heard. Pulmonary/Chest: Breath sounds normal. No stridor. No respiratory distress. He has no  wheezes. He has no rales.  Musculoskeletal: He exhibits no edema.  Lymphadenopathy:       Head (right side): No tonsillar adenopathy present.       Head (left side): No tonsillar adenopathy present.    He has no cervical adenopathy.  Neurological: He is alert. Gait normal.  Skin: No rash noted. He is not diaphoretic. No erythema. Nails show no clubbing.  Psychiatric: Mood and affect normal.    Diagnostics:    Spirometry was performed and demonstrated an FEV1 of 3.89 at 99 % of predicted.  The patient had an Asthma  Control Test with the following results: ACT Total Score: 24.    Assessment and Plan:   1. Essential hypertension   2. Asthma, mild intermittent, well-controlled   3. Allergic rhinoconjunctivitis   4. Gastroesophageal reflux disease, esophagitis presence not specified   5. Tobacco use     1. Start amlodipine 5 mg one tablet one time per day  2. Exercise, weight, nicotine  3. Check blood pressure daily and record  4. Blood - CBC with differential, CMP, ua  5. Continue omeprazole 20 mg daily  6. Continue ProAir HFA 2 puffs every 4-6 hours if needed  7. Continue Flonase 1-2 sprays each nostril 3-7 times per week during periods of upper airway symptoms  8. Return to clinic 1 month  Cameron ComeCruz is doing very well from a respiratory standpoint and a reflux standpoint and I see no need for changing his medical therapy at this point. His blood pressure is out of control and I informed him that although I am not a primary care physician I will initiate therapy and screening for his hypertension and then he can follow-up with his primary care physician at some point in the future. For some reason he has hesitancy to get a primary care physician and this may be on the basis of the fact that he has limited financial needs. This whole issue is complicated by the fact that he is coming off his parents insurance at the end of 1 month. I had a long discussion with him today about the need to perform an consistent exercise program, lose weight, and sustain from smoking tobacco. I will regroup with him in approximately 1 month or earlier if there is a problem.  Laurette SchimkeEric Kozlow, MD [facility]

## 2015-09-18 NOTE — Patient Instructions (Addendum)
  1. Start amlodipine 5 mg one tablet one time per day  2. Exercise, weight, nicotine  3. Check blood pressure daily and record  4. Blood - CBC with differential, CMP, ua  5. Continue omeprazole 20 mg daily  6. Continue ProAir HFA 2 puffs every 4-6 hours if needed  7. Continue Flonase 1-2 sprays each nostril 3-7 times per week during periods of upper airway symptoms  8. Return to clinic 1 month

## 2015-09-19 ENCOUNTER — Encounter: Payer: Self-pay | Admitting: Allergy and Immunology

## 2015-10-13 ENCOUNTER — Encounter: Payer: Self-pay | Admitting: Allergy and Immunology

## 2015-10-13 ENCOUNTER — Ambulatory Visit (INDEPENDENT_AMBULATORY_CARE_PROVIDER_SITE_OTHER): Payer: Managed Care, Other (non HMO) | Admitting: Allergy and Immunology

## 2015-10-13 VITALS — BP 132/106 | HR 72 | Resp 18

## 2015-10-13 DIAGNOSIS — Z720 Tobacco use: Secondary | ICD-10-CM | POA: Diagnosis not present

## 2015-10-13 DIAGNOSIS — I10 Essential (primary) hypertension: Secondary | ICD-10-CM

## 2015-10-13 DIAGNOSIS — K219 Gastro-esophageal reflux disease without esophagitis: Secondary | ICD-10-CM

## 2015-10-13 DIAGNOSIS — J309 Allergic rhinitis, unspecified: Secondary | ICD-10-CM | POA: Diagnosis not present

## 2015-10-13 DIAGNOSIS — J4520 Mild intermittent asthma, uncomplicated: Secondary | ICD-10-CM | POA: Diagnosis not present

## 2015-10-13 DIAGNOSIS — H1010 Acute atopic conjunctivitis, unspecified eye: Secondary | ICD-10-CM

## 2015-10-13 LAB — URINALYSIS
Bilirubin, UA: NEGATIVE
GLUCOSE, UA: NEGATIVE
KETONES UA: NEGATIVE
LEUKOCYTES UA: NEGATIVE
Nitrite, UA: NEGATIVE
Protein, UA: NEGATIVE
RBC UA: NEGATIVE
SPEC GRAV UA: 1.01 (ref 1.005–1.030)
UUROB: 0.2 mg/dL (ref 0.2–1.0)
pH, UA: 6.5 (ref 5.0–7.5)

## 2015-10-13 LAB — COMPREHENSIVE METABOLIC PANEL
A/G RATIO: 2 (ref 1.2–2.2)
ALK PHOS: 55 IU/L (ref 39–117)
ALT: 47 IU/L — AB (ref 0–44)
AST: 23 IU/L (ref 0–40)
Albumin: 4.7 g/dL (ref 3.5–5.5)
BILIRUBIN TOTAL: 0.4 mg/dL (ref 0.0–1.2)
BUN/Creatinine Ratio: 12 (ref 9–20)
BUN: 11 mg/dL (ref 6–20)
CHLORIDE: 102 mmol/L (ref 96–106)
CO2: 25 mmol/L (ref 18–29)
Calcium: 9.3 mg/dL (ref 8.7–10.2)
Creatinine, Ser: 0.91 mg/dL (ref 0.76–1.27)
GFR calc non Af Amer: 116 mL/min/{1.73_m2} (ref >59)
GFR, EST AFRICAN AMERICAN: 134 mL/min/{1.73_m2} (ref >59)
GLUCOSE: 91 mg/dL (ref 65–99)
Globulin, Total: 2.4 g/dL (ref 1.5–4.5)
POTASSIUM: 4 mmol/L (ref 3.5–5.2)
Sodium: 136 mmol/L (ref 134–144)
TOTAL PROTEIN: 7.1 g/dL (ref 6.0–8.5)

## 2015-10-13 LAB — CBC WITH DIFFERENTIAL/PLATELET
BASOS: 1 %
Basophils Absolute: 0 10*3/uL (ref 0.0–0.2)
EOS (ABSOLUTE): 0.4 10*3/uL (ref 0.0–0.4)
EOS: 5 %
Hematocrit: 48.3 % (ref 37.5–51.0)
Hemoglobin: 17 g/dL (ref 12.6–17.7)
LYMPHS ABS: 3 10*3/uL (ref 0.7–3.1)
Lymphs: 41 %
MCH: 29 pg (ref 26.6–33.0)
MCHC: 35.2 g/dL (ref 31.5–35.7)
MCV: 82 fL (ref 79–97)
MONOCYTES: 9 %
MONOS ABS: 0.6 10*3/uL (ref 0.1–0.9)
NEUTROS ABS: 3.4 10*3/uL (ref 1.4–7.0)
Neutrophils: 44 %
PLATELETS: 254 10*3/uL (ref 150–379)
RBC: 5.87 x10E6/uL — ABNORMAL HIGH (ref 4.14–5.80)
RDW: 14.8 % (ref 12.3–15.4)
WBC: 7.5 10*3/uL (ref 3.4–10.8)

## 2015-10-13 MED ORDER — OMEPRAZOLE 20 MG PO CPDR: DELAYED_RELEASE_CAPSULE | ORAL | Status: AC

## 2015-10-13 MED ORDER — LOSARTAN POTASSIUM 50 MG PO TABS: ORAL_TABLET | ORAL | Status: DC

## 2015-10-13 MED ORDER — FLUTICASONE PROPIONATE 50 MCG/ACT NA SUSP: NASAL | Status: DC

## 2015-10-13 NOTE — Patient Instructions (Signed)
  1. Continue amlodipine 5 mg one tablet one time per day  2. Start losartan 50 mg one tablet one time per day  3. Exercise, weight, nicotine  4. Check blood pressure daily and record  5. Continue omeprazole 20 mg daily  6. Continue ProAir HFA 2 puffs every 4-6 hours if needed  7. Continue Flonase 1-2 sprays each nostril 3-7 times per week during periods of upper airway symptoms  8. Will need recheck of Liver function test within 3 months  8. Return to clinic 12 weeks

## 2015-10-13 NOTE — Progress Notes (Signed)
Follow-up Note  Referring Provider: No ref. provider found Primary Provider: No PCP Per Patient Date of Office Visit: 10/13/2015  Subjective:   Cameron Bradshaw (DOB: 08/08/1990) is a 25 y.o. male who [facility]2015 in re-evaluation of the following:  HPI: Sondra ComeCruz returns to this clinic in evaluation of his blood pressure issue as well as his mild intermittent asthma and allergic rhinoconjunctivitis and reflux and tobacco abuse. He is not really follow much of the suggestions I made during his last evaluation of 09/19/2015 other than to consistently use amlodipine. His blood pressure is still relatively high although it is not as high as it was prior to starting that medication. He's had very little issues with his respiratory tract. His reflux is under control. He has had very little issues with his nose. He still continues to smoke and is not really exercising and his diet is basically the same.    Medication List           albuterol 108 (90 Base) MCG/ACT inhaler  Commonly known as:  PROAIR HFA  Inhale 2 puffs into the lungs every 4 (four) hours as needed for wheezing or shortness of breath. Reported on 09/18/2015     amLODipine 5 MG tablet  Commonly known as:  NORVASC  Take 1 tablet (5 mg total) by mouth daily.     omeprazole 20 MG capsule  Commonly known as:  PRILOSEC  Take 1 capsule (20 mg total) by mouth 2 (two) times daily. For indigestion.        Past Medical History  Diagnosis Date  . Kidney stones   . Asthma     Past Surgical History  Procedure Laterality Date  . Fracture surgery      No Known Allergies  Review of systems negative except as noted in HPI / PMHx or noted below:  Review of Systems  Constitutional: Negative.   HENT: Negative.   Eyes: Negative.   Respiratory: Negative.   Cardiovascular: Negative.   Gastrointestinal: Negative.   Genitourinary: Negative.   Musculoskeletal: Negative.   Skin:  Negative.   Neurological: Negative.   Endo/Heme/Allergies: Negative.   Psychiatric/Behavioral: Negative.      Objective:   Filed Vitals:   10/13/15 1056  BP: 132/106  Pulse: 72  Resp: 18          Physical Exam  Diagnostics: Results of blood tests obtained on 10/12/2015 identified a ALT of 47 international units/mL, a creatinine of 0.91 MG/DL, a white blood cell count of 7.5 with a normal differential, a hemoglobin of 17.0 with an MCV of 82 and a platelet count of 254. He had a normal urinalysis without protein or blood.   Spirometry was performed and demonstrated an FEV1 of 3.21 at 79 % of predicted.  The patient had an Asthma Control Test with the following results: ACT Total Score: 22.    Assessment and Plan:   1. Essential hypertension   2. Asthma, mild intermittent, well-controlled   3. Allergic rhinoconjunctivitis   4. Gastroesophageal reflux disease, esophagitis presence not specified   5. Tobacco use     1. Continue amlodipine 5 mg one tablet one time per day  2. Start losartan 50 mg one tablet one time per day  3. Exercise, weight, nicotine  4. Check blood pressure daily and record  5. Continue omeprazole 20 mg daily  6. Continue ProAir HFA 2 puffs every 4-6 hours if needed  7.  Continue Flonase 1-2 sprays each nostril 3-7 times per week during periods of upper airway symptoms  8. Will need recheck of Liver function test within 3 months  8. Return to clinic 12 weeks  [name] will now start losartan in addition to amlodipine to control his blood pressure and he really needs to exercise and lose weight and stop using nicotine. I'll see him back in this clinic in 12 weeks or earlier if there is a problem. I once again had a discussion with him today about finding a primary care doctor to address his issues. As well, his liver function tests are slightly elevated and will just check that in 3 months to make sure that this is not a trend. He'll continue on therapy  for his atopic respiratory disease and reflux.  Laurette Schimke, MD [facility]

## 2016-04-28 ENCOUNTER — Encounter (HOSPITAL_COMMUNITY): Payer: Self-pay | Admitting: Nurse Practitioner

## 2016-04-28 ENCOUNTER — Emergency Department (HOSPITAL_COMMUNITY): Payer: Medicaid Other

## 2016-04-28 ENCOUNTER — Emergency Department (HOSPITAL_COMMUNITY)
Admission: EM | Admit: 2016-04-28 | Discharge: 2016-04-28 | Disposition: A | Discharge status: Home / Self Care | Payer: Medicaid Other | Attending: Emergency Medicine | Admitting: Emergency Medicine

## 2016-04-28 DIAGNOSIS — F17210 Nicotine dependence, cigarettes, uncomplicated: Secondary | ICD-10-CM | POA: Diagnosis not present

## 2016-04-28 DIAGNOSIS — R053 Chronic cough: Secondary | ICD-10-CM

## 2016-04-28 DIAGNOSIS — Z79899 Other long term (current) drug therapy: Secondary | ICD-10-CM | POA: Diagnosis not present

## 2016-04-28 DIAGNOSIS — J45909 Unspecified asthma, uncomplicated: Secondary | ICD-10-CM | POA: Diagnosis not present

## 2016-04-28 DIAGNOSIS — R091 Pleurisy: Secondary | ICD-10-CM | POA: Diagnosis not present

## 2016-04-28 DIAGNOSIS — R05 Cough: Secondary | ICD-10-CM

## 2016-04-28 DIAGNOSIS — R071 Chest pain on breathing: Secondary | ICD-10-CM | POA: Diagnosis present

## 2016-04-28 LAB — CBC
HEMATOCRIT: 47.2 % (ref 39.0–52.0)
HEMOGLOBIN: 16.6 g/dL (ref 13.0–17.0)
MCH: 28.3 pg (ref 26.0–34.0)
MCHC: 35.2 g/dL (ref 30.0–36.0)
MCV: 80.5 fL (ref 78.0–100.0)
Platelets: 320 10*3/uL (ref 150–400)
RBC: 5.86 MIL/uL — ABNORMAL HIGH (ref 4.22–5.81)
RDW: 13.8 % (ref 11.5–15.5)
WBC: 10.2 10*3/uL (ref 4.0–10.5)

## 2016-04-28 LAB — BASIC METABOLIC PANEL
Anion gap: 9 (ref 5–15)
BUN: 10 mg/dL (ref 6–20)
CHLORIDE: 104 mmol/L (ref 101–111)
CO2: 25 mmol/L (ref 22–32)
Calcium: 9.2 mg/dL (ref 8.9–10.3)
Creatinine, Ser: 0.91 mg/dL (ref 0.61–1.24)
GFR calc non Af Amer: >60 mL/min (ref >60)
GLUCOSE: 101 mg/dL — AB (ref 65–99)
POTASSIUM: 3.9 mmol/L (ref 3.5–5.1)
SODIUM: 138 mmol/L (ref 135–145)

## 2016-04-28 LAB — D-DIMER, QUANTITATIVE: D-Dimer, Quant: <0.27 ug/mL-FEU (ref 0.00–0.50)

## 2016-04-28 LAB — I-STAT TROPONIN, ED: Troponin i, poc: 0.01 ng/mL (ref 0.00–0.08)

## 2016-04-28 MED ORDER — IPRATROPIUM BROMIDE 0.02 % IN SOLN
0.5000 mg | Freq: Once | RESPIRATORY_TRACT | Status: AC
  Administered 2016-04-28: 0.5 mg via RESPIRATORY_TRACT
  Filled 2016-04-28: qty 2.5

## 2016-04-28 MED ORDER — ALBUTEROL SULFATE (2.5 MG/3ML) 0.083% IN NEBU
5.0000 mg | INHALATION_SOLUTION | Freq: Once | RESPIRATORY_TRACT | Status: AC
  Administered 2016-04-28: 5 mg via RESPIRATORY_TRACT
  Filled 2016-04-28: qty 6

## 2016-04-28 MED ORDER — PREDNISONE 10 MG (21) PO TBPK: 10.0000 mg | ORAL_TABLET | Freq: Every day | ORAL | 0 refills | Status: DC

## 2016-04-28 MED ORDER — IPRATROPIUM-ALBUTEROL 0.5-2.5 (3) MG/3ML IN SOLN: 3.0000 mL | Freq: Once | RESPIRATORY_TRACT | Status: DC

## 2016-04-28 MED ORDER — KETOROLAC TROMETHAMINE 60 MG/2ML IM SOLN
60.0000 mg | Freq: Once | INTRAMUSCULAR | Status: AC
  Administered 2016-04-28: 60 mg via INTRAMUSCULAR
  Filled 2016-04-28: qty 2

## 2016-04-28 MED ORDER — IPRATROPIUM-ALBUTEROL 0.5-2.5 (3) MG/3ML IN SOLN
3.0000 mL | Freq: Once | RESPIRATORY_TRACT | Status: AC
  Administered 2016-04-28: 3 mL via RESPIRATORY_TRACT
  Filled 2016-04-28: qty 3

## 2016-04-28 MED ORDER — IBUPROFEN 800 MG PO TABS: 800.0000 mg | ORAL_TABLET | Freq: Three times a day (TID) | ORAL | 0 refills | Status: DC | PRN

## 2016-04-28 MED ORDER — HYDROCOD POLST-CPM POLST ER 10-8 MG/5ML PO SUER: 5.0000 mL | Freq: Two times a day (BID) | ORAL | 0 refills | Status: DC | PRN

## 2016-04-28 MED ORDER — ALBUTEROL SULFATE HFA 108 (90 BASE) MCG/ACT IN AERS
2.0000 | INHALATION_SPRAY | Freq: Once | RESPIRATORY_TRACT | Status: AC
  Administered 2016-04-28: 2 via RESPIRATORY_TRACT
  Filled 2016-04-28: qty 6.7

## 2016-04-28 MED ORDER — HYDROCOD POLST-CPM POLST ER 10-8 MG/5ML PO SUER
5.0000 mL | Freq: Once | ORAL | Status: AC
  Administered 2016-04-28: 5 mL via ORAL
  Filled 2016-04-28: qty 5

## 2016-04-28 MED ORDER — PREDNISONE 20 MG PO TABS
60.0000 mg | ORAL_TABLET | Freq: Once | ORAL | Status: AC
  Administered 2016-04-28: 60 mg via ORAL
  Filled 2016-04-28: qty 3

## 2016-04-28 NOTE — Discharge Instructions (Signed)
Read the information below.  Use the prescribed medication as directed.  Please discuss all new medications with your pharmacist.  You may return to the Emergency Department at any time for worsening condition or any new symptoms that concern you.   If you develop worsening chest pain, shortness of breath, fever, you pass out, or become weak or dizzy, return to the ER for a recheck.    °

## 2016-04-28 NOTE — ED Provider Notes (Signed)
[facility]-EMERGENCY DEPT Provider Note   CSN: [id number] Arrival date & time: 04/28/16  1553     History   Chief Complaint Chief Complaint  Patient presents with  . Chest Pain    HPI Cameron Bradshaw is a 25 y.o. male.  HPI   Pt with hx asthma, HTN, current smoker presents with chronic cough, three days of chest pain, pain is worse with coughing and inspiration, associated SOB and cough productive of brown, yellow, and clear sputum.  Has some nasal congestion, occasional sore throat.  Denies hemoptysis, leg swelling, immobilization, personal or family hx blood clots.  Pt does take antihypertensives and takes them regularly but has not taken them today.   Past Medical History:  Diagnosis Date  . Asthma   . Kidney stones     Patient Active Problem List   Diagnosis Date Noted  . Asthma 02/01/2015  . Allergic rhinitis 02/01/2015  . H/O gastroesophageal reflux (GERD) 02/01/2015  . Tobacco abuse 02/01/2015  . 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 FRACTURE SURGERY         Home Medications    Prior to Admission medications   Medication Sig Start Date End Date Taking? Authorizing Provider  amLODipine (NORVASC) 5 MG tablet Take 1 tablet (5 mg total) by mouth daily. 09/18/15  Yes Jessica PriestEric J Kozlow, MD  losartan (COZAAR) 50 MG tablet TAKE ONE TABLET ONCE DAILY AS DIRECTED. 10/13/15  Yes Jessica PriestEric J Kozlow, MD  omeprazole (PRILOSEC) 20 MG capsule Take one tablet by mouth once a day as directed. 10/13/15  Yes Jessica PriestEric J Kozlow, MD  albuterol (PROAIR HFA) 108 (90 Base) MCG/ACT inhaler Inhale 2 puffs into the lungs every 4 (four) hours as needed for wheezing or shortness of breath. Reported on 09/18/2015 Patient not taking: Reported on 04/28/2016 09/18/15   Jessica PriestEric J Kozlow, MD  chlorpheniramine-HYDROcodone [facility](TUSSIONEX PENNKINETIC ER) 10-8 MG/5ML SUER Take 5 mLs by mouth every 12 (twelve) hours as needed for cough. 04/28/16   Trixie DredgeEmily Shaida Route, PA-C    fluticasone (FLONASE) 50 MCG/ACT nasal spray Use one to two sprays in each nostril once daily as directed. Patient not taking: Reported on 04/28/2016 10/13/15   Jessica PriestEric J Kozlow, MD  ibuprofen (ADVIL,MOTRIN) 800 MG tablet Take 1 tablet (800 mg total) by mouth every 8 (eight) hours as needed for mild pain or moderate pain. 04/28/16   Trixie DredgeEmily Danelia Snodgrass, PA-C  predniSONE (STERAPRED UNI-PAK 21 TAB) 10 MG (21) TBPK tablet Take 1 tablet (10 mg total) by mouth daily. Day 1: take 6 tabs.  Day 2: 5 tabs  Day 3: 4 tabs  Day 4: 3 tabs  Day 5: 2 tabs  Day 6: 1 tab 04/28/16   Trixie DredgeEmily Lulamae Skorupski, PA-C    Family History Family History  Problem Relation Age of Onset  . Diabetes Father     Social History Social History  Substance Use Topics  . Smoking status: Current Every Day Smoker    Packs/day: 0.50    Types: Cigarettes  . Smokeless tobacco: Never Used  . Alcohol use Yes     Comment: occ     Allergies   Patient has no known allergies.   Review of Systems Review of Systems  All other systems reviewed and are negative.    Physical Exam Updated Vital Signs BP 133/78 (BP Location: Left Arm)   Pulse 112   Temp 98.9 F (37.2 C)   Resp 21   SpO2 94%  Physical Exam  Constitutional: He appears well-developed and well-nourished. No distress.  HENT:  Head: Normocephalic and atraumatic.  Neck: Normal range of motion. Neck supple.  Cardiovascular: Normal rate and regular rhythm.   Pulmonary/Chest:  Tachypnea, shallow respirations.  ?mild rales at bilateral bases.    Abdominal: Soft. He exhibits no distension and no mass. There is no tenderness. There is no rebound and no guarding.  Musculoskeletal: He exhibits no edema or tenderness.  Neurological: He is alert. He exhibits normal muscle tone.  Skin: He is not diaphoretic.  Nursing note and vitals reviewed.    ED Treatments / Results  Labs (all labs ordered are listed, but only abnormal results are displayed) Labs Reviewed  BASIC METABOLIC PANEL -  Abnormal; Notable for the following:       Result Value   Glucose, Bld 101 (*)    All other components within normal limits  CBC - Abnormal; Notable for the following:    RBC 5.86 (*)    All other components within normal limits  D-DIMER, QUANTITATIVE (NOT AT [facility])  Rosezena Sensor, ED    EKG  EKG Interpretation None       Radiology Dg Chest 2 View  Result Date: 04/28/2016 CLINICAL DATA:  Chest pain for 1 day EXAM: CHEST  2 VIEW COMPARISON:  09/17/2014 FINDINGS: The heart size and mediastinal contours are within normal limits. Both lungs are clear. The visualized skeletal structures are unremarkable. IMPRESSION: No active cardiopulmonary disease. Electronically Signed   By: Alcide Clever M.D.   On: 04/28/2016 17:02    Procedures Procedures (including critical care time)  Medications Ordered in ED Medications  ipratropium-albuterol (DUONEB) 0.5-2.5 (3) MG/3ML nebulizer solution 3 mL (3 mLs Nebulization Given 04/28/16 1707)  ketorolac (TORADOL) injection 60 mg (60 mg Intramuscular Given 04/28/16 1710)  albuterol (PROVENTIL) (2.5 MG/3ML) 0.083% nebulizer solution 5 mg (5 mg Nebulization Given 04/28/16 1807)  ipratropium (ATROVENT) nebulizer solution 0.5 mg (0.5 mg Nebulization Given 04/28/16 1807)  predniSONE (DELTASONE) tablet 60 mg (60 mg Oral Given 04/28/16 1803)  chlorpheniramine-HYDROcodone (TUSSIONEX) 10-8 MG/5ML suspension 5 mL (5 mLs Oral Given 04/28/16 1803)  albuterol (PROVENTIL HFA;VENTOLIN HFA) 108 (90 Base) MCG/ACT inhaler 2 puff (2 puffs Inhalation Given 04/28/16 1932)     Initial Impression / Assessment and Plan / ED Course  I have reviewed the triage vital signs and the nursing notes.  Pertinent labs & imaging results that were available during my care of the patient were reviewed by me and considered in my medical decision making (see chart for details).  Clinical Course    Afebrile, nontoxic patient with chronic cough that pt reports he has for 6  months/year.  He is a smoker.  Recent chest pain associated with cough. It is pleuritic.  No risk factors for PE.  D-dimer is negative. Doubt PE, PNA, aortic dissection.  Pt better with toradol, breathing treatments.  Repeat exam demonstrate clear lungs bilaterally, pt able to breath more fully.  Advised smoking cessation, counseled > 5 minutes.  Also advised close PCP follow up for asthma, and treatment for chronic environmental allergies.   D/C home with albuterol, tussionex, prednisone.  PCP resources for follow up.   Discussed result, findings, treatment, and follow up  with patient.  Pt given return precautions.  Pt verbalizes understanding and agrees with plan.       Final Clinical Impressions(s) / ED Diagnoses   Final diagnoses:  Pleurisy  Chronic cough    New Prescriptions Discharge Medication List  as of 04/28/2016  7:27 PM    START taking these medications   Details  chlorpheniramine-HYDROcodone (TUSSIONEX PENNKINETIC ER) 10-8 MG/5ML SUER Take 5 mLs by mouth every 12 (twelve) hours as needed for cough., Starting Sun 04/28/2016, Print    ibuprofen (ADVIL,MOTRIN) 800 MG tablet Take 1 tablet (800 mg total) by mouth every 8 (eight) hours as needed for mild pain or moderate pain., Starting Sun 04/28/2016, Print    predniSONE (STERAPRED UNI-PAK 21 TAB) 10 MG (21) TBPK tablet Take 1 tablet (10 mg total) by mouth daily. Day 1: take 6 tabs.  Day 2: 5 tabs  Day 3: 4 tabs  Day 4: 3 tabs  Day 5: 2 tabs  Day 6: 1 tab, Starting Sun 04/28/2016, Print         SantaquinEmily Rylah Fukuda, PA-C 04/28/16 1957    Loren Raceravid Yelverton, MD 05/01/16 [phone]

## 2016-04-28 NOTE — ED Triage Notes (Signed)
Pt is c/o "massive chest pain" onset 2 days ago, associated with coughing and inability to take deep breaths. States that he has chronic bronchitis but he thinks he might have pneumonia. Denies fevers or chills.

## 2017-02-18 ENCOUNTER — Emergency Department (HOSPITAL_COMMUNITY)
Admission: EM | Admit: 2017-02-18 | Discharge: 2017-02-19 | Disposition: A | Discharge status: Home / Self Care | Payer: Medicaid Other | Attending: Emergency Medicine | Admitting: Emergency Medicine

## 2017-02-18 ENCOUNTER — Encounter (HOSPITAL_COMMUNITY): Payer: Self-pay | Admitting: Emergency Medicine

## 2017-02-18 DIAGNOSIS — I10 Essential (primary) hypertension: Secondary | ICD-10-CM | POA: Insufficient documentation

## 2017-02-18 DIAGNOSIS — Z79899 Other long term (current) drug therapy: Secondary | ICD-10-CM | POA: Insufficient documentation

## 2017-02-18 DIAGNOSIS — J45909 Unspecified asthma, uncomplicated: Secondary | ICD-10-CM | POA: Diagnosis not present

## 2017-02-18 DIAGNOSIS — R197 Diarrhea, unspecified: Secondary | ICD-10-CM | POA: Diagnosis present

## 2017-02-18 DIAGNOSIS — R1030 Lower abdominal pain, unspecified: Secondary | ICD-10-CM

## 2017-02-18 DIAGNOSIS — F17210 Nicotine dependence, cigarettes, uncomplicated: Secondary | ICD-10-CM | POA: Diagnosis not present

## 2017-02-18 HISTORY — DX: Essential (primary) hypertension: I10

## 2017-02-18 LAB — LIPASE, BLOOD: LIPASE: 19 U/L (ref 11–51)

## 2017-02-18 LAB — CBC
HCT: 48 % (ref 39.0–52.0)
Hemoglobin: 16.8 g/dL (ref 13.0–17.0)
MCH: 29 pg (ref 26.0–34.0)
MCHC: 35 g/dL (ref 30.0–36.0)
MCV: 82.9 fL (ref 78.0–100.0)
PLATELETS: 259 10*3/uL (ref 150–400)
RBC: 5.79 MIL/uL (ref 4.22–5.81)
RDW: 14.5 % (ref 11.5–15.5)
WBC: 12.4 10*3/uL — ABNORMAL HIGH (ref 4.0–10.5)

## 2017-02-18 LAB — COMPREHENSIVE METABOLIC PANEL
ALK PHOS: 56 U/L (ref 38–126)
ALT: 30 U/L (ref 17–63)
AST: 25 U/L (ref 15–41)
Albumin: 4 g/dL (ref 3.5–5.0)
Anion gap: 8 (ref 5–15)
BUN: 11 mg/dL (ref 6–20)
CALCIUM: 9.2 mg/dL (ref 8.9–10.3)
CHLORIDE: 103 mmol/L (ref 101–111)
CO2: 23 mmol/L (ref 22–32)
CREATININE: 1.03 mg/dL (ref 0.61–1.24)
GFR calc Af Amer: >60 mL/min (ref >60)
Glucose, Bld: 93 mg/dL (ref 65–99)
Potassium: 3.9 mmol/L (ref 3.5–5.1)
Sodium: 134 mmol/L — ABNORMAL LOW (ref 135–145)
Total Bilirubin: 0.7 mg/dL (ref 0.3–1.2)
Total Protein: 6.5 g/dL (ref 6.5–8.1)

## 2017-02-18 LAB — URINALYSIS, ROUTINE W REFLEX MICROSCOPIC
BILIRUBIN URINE: NEGATIVE
GLUCOSE, UA: NEGATIVE mg/dL
HGB URINE DIPSTICK: NEGATIVE
KETONES UR: NEGATIVE mg/dL
Leukocytes, UA: NEGATIVE
Nitrite: NEGATIVE
Protein, ur: NEGATIVE mg/dL
Specific Gravity, Urine: 1.023 (ref 1.005–1.030)
pH: 5 (ref 5.0–8.0)

## 2017-02-18 MED ORDER — SODIUM CHLORIDE 0.9 % IV BOLUS (SEPSIS)
500.0000 mL | Freq: Once | INTRAVENOUS | Status: AC
  Administered 2017-02-19: 500 mL via INTRAVENOUS

## 2017-02-18 MED ORDER — KETOROLAC TROMETHAMINE 30 MG/ML IJ SOLN
30.0000 mg | Freq: Once | INTRAMUSCULAR | Status: AC
  Administered 2017-02-19: 30 mg via INTRAVENOUS
  Filled 2017-02-18: qty 1

## 2017-02-18 MED ORDER — DICYCLOMINE HCL 20 MG PO TABS
20.0000 mg | ORAL_TABLET | Freq: Once | ORAL | Status: AC
  Administered 2017-02-19: 20 mg via ORAL
  Filled 2017-02-18: qty 1

## 2017-02-18 NOTE — ED Notes (Signed)
Patient transported to X-ray 

## 2017-02-18 NOTE — ED Triage Notes (Signed)
Patient reports low abdominal pain with emesis and multiple diarrhea onset this evening , pt. stated he noted some blood in his stools , denies fever or chills . He was seen at an urgent care this morning with no definite findings .

## 2017-02-18 NOTE — ED Provider Notes (Signed)
[facility]-EMERGENCY DEPT Provider Note   CSN: [id number] Arrival date & time: 02/18/17  2035     History   Chief Complaint Chief Complaint  Patient presents with  . Abdominal Pain  . Diarrhea    HPI Deshun Sedivy is a 26 y.o. male.  The history is provided by the patient and medical records. No language interpreter was used.  Abdominal Pain   Associated symptoms include diarrhea, nausea and vomiting. Pertinent negatives include constipation.  Diarrhea   Associated symptoms include abdominal pain and vomiting.    Gaje Tennyson is a 26 y.o. male  with a PMH of asthma, HTN, kidney stones who presents to the Emergency Department complaining of lower abdominal cramping beginning around 8 AM this morning. Associated with multiple episodes of small loose stools. Patient states that he has noticed some blood in his stools throughout the day. He had an episode of emesis at 2 PM, prompting him to go to the urgent care. He states that at the urgent care, they wanted to do blood work, which he declined. They told him if he got any worse to come to the emergency department. He states that he did not have any further episodes of emesis, but abdominal pain intensified. No fever or chills. No known sick contacts. No medications taken prior to arrival for symptoms. No history of similar sxs.    Past Medical History:  Diagnosis Date  . Asthma   . Hypertension   . Kidney stones     Patient Active Problem List   Diagnosis Date Noted  . Asthma 02/01/2015  . Allergic rhinitis 02/01/2015  . H/O gastroesophageal reflux (GERD) 02/01/2015  . Tobacco abuse 02/01/2015  . 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 FRACTURE SURGERY         Home Medications    Prior to Admission medications   Medication Sig Start Date End Date Taking? Authorizing Provider  omeprazole (PRILOSEC) 20 MG capsule Take one tablet by mouth once a day as  directed. 10/13/15  Yes Kozlow, Alvira Philips, MD  albuterol (PROAIR HFA) 108 (90 Base) MCG/ACT inhaler Inhale 2 puffs into the lungs every 4 (four) hours as needed for wheezing or shortness of breath. Reported on 09/18/2015 Patient not taking: Reported on 04/28/2016 09/18/15   Jessica Priest, MD  amLODipine (NORVASC) 5 MG tablet Take 1 tablet (5 mg total) by mouth daily. 02/19/17   Ward, Chase Picket, PA-C  chlorpheniramine-HYDROcodone (TUSSIONEX PENNKINETIC ER) 10-8 MG/5ML SUER Take 5 mLs by mouth every 12 (twelve) hours as needed for cough. Patient not taking: Reported on 02/19/2017 04/28/16   Trixie Dredge, PA-C  dicyclomine (BENTYL) 20 MG tablet Take 1 tablet (20 mg total) by mouth 3 (three) times daily as needed (abdominal cramping). 02/19/17   Ward, Chase Picket, PA-C  fluticasone (FLONASE) 50 MCG/ACT nasal spray Use one to two sprays in each nostril once daily as directed. Patient not taking: Reported on 04/28/2016 10/13/15   Jessica Priest, MD  ibuprofen (ADVIL,MOTRIN) 800 MG tablet Take 1 tablet (800 mg total) by mouth every 8 (eight) hours as needed for mild pain or moderate pain. Patient not taking: Reported on 02/19/2017 04/28/16   Trixie Dredge, PA-C  losartan (COZAAR) 50 MG tablet TAKE ONE TABLET ONCE DAILY AS DIRECTED. 02/19/17   Ward, Chase Picket, PA-C  ondansetron (ZOFRAN ODT) 4 MG disintegrating tablet Take 1 tablet (4 mg total) by mouth every 8 (eight) hours as needed  for nausea or vomiting. 02/19/17   Ward, Chase Picket, PA-C  predniSONE (STERAPRED UNI-PAK 21 TAB) 10 MG (21) TBPK tablet Take 1 tablet (10 mg total) by mouth daily. Day 1: take 6 tabs.  Day 2: 5 tabs  Day 3: 4 tabs  Day 4: 3 tabs  Day 5: 2 tabs  Day 6: 1 tab Patient not taking: Reported on 02/19/2017 04/28/16   Trixie Dredge, PA-C    Family History Family History  Problem Relation Age of Onset  . Diabetes Father     Social History Social History  Substance Use Topics  . Smoking status: Current Every Day Smoker     Packs/day: 0.50    Types: Cigarettes  . Smokeless tobacco: Never Used  . Alcohol use Yes     Comment: occ     Allergies   Patient has no known allergies.   Review of Systems Review of Systems  Gastrointestinal: Positive for abdominal pain, blood in stool, diarrhea, nausea and vomiting. Negative for constipation.  Musculoskeletal: Negative for back pain.     Physical Exam Updated Vital Signs BP (!) 156/106   Pulse 84   Temp 98.6 F (37 C) (Oral)   Resp 16   Ht  (1.702 m)   Wt 96.2 kg (212 lb)   SpO2 96%   BMI 33.20 kg/m   Physical Exam  Constitutional: He is oriented to person, place, and time. He appears well-developed and well-nourished. No distress.  HENT:  Head: Normocephalic and atraumatic.  Cardiovascular: Normal rate, regular rhythm and normal heart sounds.   No murmur heard. Pulmonary/Chest: Effort normal and breath sounds normal. No respiratory distress.  Abdominal: Soft. He exhibits no distension.  Diffuse tenderness across lower abdomen without focality. No point tenderness at McBurney's. Negative poas and obturator. No rebound or guarding. No CVA tenderness.  Musculoskeletal: He exhibits no edema.  Neurological: He is alert and oriented to person, place, and time.  Skin: Skin is warm and dry.  Nursing note and vitals reviewed.    ED Treatments / Results  Labs (all labs ordered are listed, but only abnormal results are displayed) Labs Reviewed  COMPREHENSIVE METABOLIC PANEL - Abnormal; Notable for the following:       Result Value   Sodium 134 (*)    All other components within normal limits  CBC - Abnormal; Notable for the following:    WBC 12.4 (*)    All other components within normal limits  LIPASE, BLOOD  URINALYSIS, ROUTINE W REFLEX MICROSCOPIC    EKG  EKG Interpretation None       Radiology Dg Abdomen 1 View  Result Date: 02/19/2017 CLINICAL DATA:  Acute onset of lower abdominal pain. Initial encounter. EXAM: ABDOMEN - 1  VIEW COMPARISON:  CT of the abdomen and pelvis performed 10/12/2011, and abdominal radiograph performed 10/15/2011 FINDINGS: The visualized bowel gas pattern is unremarkable. Scattered air and stool filled loops of colon are seen; no abnormal dilatation of small bowel loops is seen to suggest small bowel obstruction. No free intra-abdominal air is identified, though evaluation for free air is limited on a single supine view. The visualized osseous structures are within normal limits; the sacroiliac joints are unremarkable in appearance. IMPRESSION: Unremarkable bowel gas pattern; no free intra-abdominal air seen. Small to moderate amount of stool noted in the colon. Electronically Signed   By: Roanna Raider M.D.   On: 02/19/2017 00:21    Procedures Procedures (including critical care time)  Medications Ordered in ED  Medications  sodium chloride 0.9 % bolus 500 mL (0 mLs Intravenous Stopped 02/19/17 0050)  ketorolac (TORADOL) 30 MG/ML injection 30 mg (30 mg Intravenous Given 02/19/17 0015)  dicyclomine (BENTYL) tablet 20 mg (20 mg Oral Given 02/19/17 0015)  amLODipine (NORVASC) tablet 5 mg (5 mg Oral Given 02/19/17 0015)  losartan (COZAAR) tablet 50 mg (50 mg Oral Given 02/19/17 0020)     Initial Impression / Assessment and Plan / ED Course  I have reviewed the triage vital signs and the nursing notes.  Pertinent labs & imaging results that were available during my care of the patient were reviewed by me and considered in my medical decision making (see chart for details).    Lonald Troiani Perrier is a 26 y.o. male who presents to ED for nausea, vomiting and diarrhea which began today. On exam, patient is afebrile, non-toxic appearing with a non-surgical abdominal exam. IV fluids and symptomatic meds given. Labs reviewed and reassuring. Abdominal plain film reassuring. On repeat examination, patient is now tolerating PO with no episodes of emesis since medication administration. Repeat  abdominal exam improved. Evaluation does not show pathology that would require ongoing emergent intervention or inpatient treatment. Rx for bentyl and zofran given. PCP follow up encouraged. Spoke at length with patient about signs or symptoms that should prompt return to emergency Department including inability to tolerate PO, fevers, focal localization of abdominal pain, new/worsening symptoms or any additional concerns.   Patient's blood pressure elevated in ED today, 167/130. Patient with known history of hypertension and on Norvasc and losartan. He has been getting refills at the urgent care, however they will no longer refill his medication and he has been out of these medications for a month. Home meds given and BP downtrending. PCP follow up for BP check strongly encouraged.  Patient understands diagnosis and plan of care as dictated above. All questions answered.   Final Clinical Impressions(s) / ED Diagnoses   Final diagnoses:  Lower abdominal pain  Diarrhea of presumed infectious origin    New Prescriptions New Prescriptions   DICYCLOMINE (BENTYL) 20 MG TABLET    Take 1 tablet (20 mg total) by mouth 3 (three) times daily as needed (abdominal cramping).   ONDANSETRON (ZOFRAN ODT) 4 MG DISINTEGRATING TABLET    Take 1 tablet (4 mg total) by mouth every 8 (eight) hours as needed for nausea or vomiting.     Ward, Chase Picket, PA-C 02/19/17 Lyda Jester    Zadie Rhine, MD 02/19/17 [phone]

## 2017-02-19 ENCOUNTER — Emergency Department (HOSPITAL_COMMUNITY): Payer: Medicaid Other

## 2017-02-19 MED ORDER — LOSARTAN POTASSIUM 50 MG PO TABS
50.0000 mg | ORAL_TABLET | Freq: Once | ORAL | Status: AC
  Administered 2017-02-19: 50 mg via ORAL
  Filled 2017-02-19: qty 1

## 2017-02-19 MED ORDER — DICYCLOMINE HCL 20 MG PO TABS: 20.0000 mg | ORAL_TABLET | Freq: Three times a day (TID) | ORAL | 0 refills | Status: DC | PRN

## 2017-02-19 MED ORDER — AMLODIPINE BESYLATE 5 MG PO TABS
5.0000 mg | ORAL_TABLET | Freq: Once | ORAL | Status: AC
  Administered 2017-02-19: 5 mg via ORAL
  Filled 2017-02-19: qty 1

## 2017-02-19 MED ORDER — AMLODIPINE BESYLATE 5 MG PO TABS: 5.0000 mg | ORAL_TABLET | Freq: Every day | ORAL | 1 refills | Status: AC

## 2017-02-19 MED ORDER — LOSARTAN POTASSIUM 50 MG PO TABS: ORAL_TABLET | ORAL | 3 refills | Status: DC

## 2017-02-19 MED ORDER — ONDANSETRON 4 MG PO TBDP: 4.0000 mg | ORAL_TABLET | Freq: Three times a day (TID) | ORAL | 0 refills | Status: DC | PRN

## 2017-02-19 NOTE — Discharge Instructions (Signed)
It was my pleasure taking care of you today!   Follow up with your primary care doctor to discuss your hospital visit. If you do not have one, please see the information listed below. Continue to hydrate orally with small sips of fluids throughout the day. Use Zofran as directed for nausea & vomiting. Bentyl as needed for abdominal pain.   I have refilled your blood pressure medication. Please follow-up with the primary care provider for blood pressure recheck. It is very important to take this medication daily as directed.  The 'BRAT' diet is suggested, then progress to diet as tolerated as symptoms abate.  Bananas.  Rice.  Applesauce.  Toast (and other simple starches such as crackers, potatoes, noodles).   SEEK IMMEDIATE MEDICAL ATTENTION IF: You begin having localized abdominal pain that does not go away or becomes severe A temperature above 101 develops Repeated vomiting occurs (multiple uncontrollable episodes) or you are unable to keep fluids dow New or worsening symptoms develop, any additional concerns.   To find a primary care or specialty doctor please call [phone] or [phone] to access "[place] Find a Doctor Service."  You may also go on the [facility] website at [email or web address]  There are also multiple [place] [place] and [facility] practices throughout the [place] that are frequently accepting new patients. You may find a clinic that is close to your home and contact them.  [facility] - [address] [place] [address] [phone]  [facility] in [place] (also locations in [place] and [place]) - [address] [employer] [place] [phone]  [facility] - [address] [place] [phone]

## 2017-05-16 ENCOUNTER — Ambulatory Visit: Payer: Self-pay | Admitting: Emergency Medicine

## 2017-05-16 DIAGNOSIS — Z23 Encounter for immunization: Secondary | ICD-10-CM

## 2017-08-05 ENCOUNTER — Emergency Department (HOSPITAL_COMMUNITY): Payer: Medicaid Other

## 2017-08-05 ENCOUNTER — Other Ambulatory Visit: Payer: Self-pay

## 2017-08-05 ENCOUNTER — Encounter (HOSPITAL_COMMUNITY): Payer: Self-pay | Admitting: *Deleted

## 2017-08-05 ENCOUNTER — Emergency Department (HOSPITAL_COMMUNITY)
Admission: EM | Admit: 2017-08-05 | Discharge: 2017-08-05 | Disposition: A | Discharge status: Home / Self Care | Payer: Medicaid Other | Attending: Emergency Medicine | Admitting: Emergency Medicine

## 2017-08-05 DIAGNOSIS — Y929 Unspecified place or not applicable: Secondary | ICD-10-CM | POA: Insufficient documentation

## 2017-08-05 DIAGNOSIS — S301XXA Contusion of abdominal wall, initial encounter: Secondary | ICD-10-CM | POA: Diagnosis not present

## 2017-08-05 DIAGNOSIS — X500XXA Overexertion from strenuous movement or load, initial encounter: Secondary | ICD-10-CM | POA: Insufficient documentation

## 2017-08-05 DIAGNOSIS — Z79899 Other long term (current) drug therapy: Secondary | ICD-10-CM | POA: Diagnosis not present

## 2017-08-05 DIAGNOSIS — F17210 Nicotine dependence, cigarettes, uncomplicated: Secondary | ICD-10-CM | POA: Diagnosis not present

## 2017-08-05 DIAGNOSIS — J45909 Unspecified asthma, uncomplicated: Secondary | ICD-10-CM | POA: Diagnosis not present

## 2017-08-05 DIAGNOSIS — Y998 Other external cause status: Secondary | ICD-10-CM | POA: Insufficient documentation

## 2017-08-05 DIAGNOSIS — R1031 Right lower quadrant pain: Secondary | ICD-10-CM | POA: Diagnosis present

## 2017-08-05 DIAGNOSIS — I10 Essential (primary) hypertension: Secondary | ICD-10-CM | POA: Insufficient documentation

## 2017-08-05 DIAGNOSIS — Y93H3 Activity, building and construction: Secondary | ICD-10-CM | POA: Diagnosis not present

## 2017-08-05 LAB — DIFFERENTIAL
BASOS ABS: 0 10*3/uL (ref 0.0–0.1)
BASOS PCT: 0 %
EOS ABS: 0.2 10*3/uL (ref 0.0–0.7)
Eosinophils Relative: 2 %
Lymphocytes Relative: 26 %
Lymphs Abs: 3 10*3/uL (ref 0.7–4.0)
Monocytes Absolute: 0.8 10*3/uL (ref 0.1–1.0)
Monocytes Relative: 7 %
NEUTROS PCT: 65 %
Neutro Abs: 7.6 10*3/uL (ref 1.7–7.7)

## 2017-08-05 LAB — COMPREHENSIVE METABOLIC PANEL
ALBUMIN: 4.1 g/dL (ref 3.5–5.0)
ALK PHOS: 62 U/L (ref 38–126)
ALT: 45 U/L (ref 17–63)
AST: 42 U/L — AB (ref 15–41)
Anion gap: 9 (ref 5–15)
BILIRUBIN TOTAL: 0.6 mg/dL (ref 0.3–1.2)
BUN: 12 mg/dL (ref 6–20)
CALCIUM: 9.4 mg/dL (ref 8.9–10.3)
CO2: 24 mmol/L (ref 22–32)
Chloride: 107 mmol/L (ref 101–111)
Creatinine, Ser: 0.88 mg/dL (ref 0.61–1.24)
GFR calc Af Amer: >60 mL/min (ref >60)
GFR calc non Af Amer: >60 mL/min (ref >60)
GLUCOSE: 120 mg/dL — AB (ref 65–99)
Potassium: 3.8 mmol/L (ref 3.5–5.1)
Sodium: 140 mmol/L (ref 135–145)
TOTAL PROTEIN: 7.1 g/dL (ref 6.5–8.1)

## 2017-08-05 LAB — CBC
HCT: 50.3 % (ref 39.0–52.0)
Hemoglobin: 17.2 g/dL — ABNORMAL HIGH (ref 13.0–17.0)
MCH: 28.7 pg (ref 26.0–34.0)
MCHC: 34.2 g/dL (ref 30.0–36.0)
MCV: 84 fL (ref 78.0–100.0)
PLATELETS: 283 10*3/uL (ref 150–400)
RBC: 5.99 MIL/uL — ABNORMAL HIGH (ref 4.22–5.81)
RDW: 14.9 % (ref 11.5–15.5)
WBC: 11.6 10*3/uL — ABNORMAL HIGH (ref 4.0–10.5)

## 2017-08-05 LAB — LIPASE, BLOOD: Lipase: 24 U/L (ref 11–51)

## 2017-08-05 MED ORDER — SODIUM CHLORIDE 0.9 % IV BOLUS
1000.0000 mL | Freq: Once | INTRAVENOUS | Status: AC
  Administered 2017-08-05: 1000 mL via INTRAVENOUS

## 2017-08-05 MED ORDER — OXYCODONE-ACETAMINOPHEN 5-325 MG PO TABS
1.0000 | ORAL_TABLET | Freq: Once | ORAL | Status: AC
Start: 2017-08-05 — End: 2017-08-05
  Administered 2017-08-05: 1 via ORAL
  Filled 2017-08-05: qty 1

## 2017-08-05 MED ORDER — HYDROMORPHONE HCL 1 MG/ML IJ SOLN
1.0000 mg | Freq: Once | INTRAMUSCULAR | Status: AC
  Administered 2017-08-05: 1 mg via INTRAVENOUS
  Filled 2017-08-05: qty 1

## 2017-08-05 MED ORDER — METHOCARBAMOL 500 MG PO TABS: 500.0000 mg | ORAL_TABLET | Freq: Two times a day (BID) | ORAL | 0 refills | Status: DC

## 2017-08-05 MED ORDER — OXYCODONE HCL 5 MG PO TABS: 5.0000 mg | ORAL_TABLET | ORAL | 0 refills | Status: DC | PRN

## 2017-08-05 MED ORDER — DEXTROSE 5 % IV SOLN
1000.0000 mg | Freq: Once | INTRAVENOUS | Status: AC
  Administered 2017-08-05: 1000 mg via INTRAVENOUS
  Filled 2017-08-05: qty 10

## 2017-08-05 MED ORDER — METHOCARBAMOL 1000 MG/10ML IJ SOLN
1000.0000 mg | Freq: Once | INTRAMUSCULAR | Status: DC
  Filled 2017-08-05: qty 10

## 2017-08-05 MED ORDER — IOPAMIDOL (ISOVUE-300) INJECTION 61%
INTRAVENOUS | Status: AC
  Administered 2017-08-05: 100 mL
  Filled 2017-08-05: qty 100

## 2017-08-05 NOTE — ED Triage Notes (Signed)
Pt states for 2 days had a lower abdominal pain across and hurt with standing and bending over.  Then last night got massive pain to right lower quadrant.  Cannot tell if swollen, hurts so bad to move.  Pt does have appendix.  No back pain

## 2017-08-05 NOTE — Discharge Instructions (Addendum)
Please read instructions below. Apply heat to your abdomen, for symptom relief. You can also apply ice for 20 minutes at a time. You can take oxycodone every 4-6 hours as needed for severe pain. You can take tylenol every 4-6 hours with this medication. You can take robaxin every 12 hours as needed for muscle spasm. You can expect to feel sore. You may see bruising on your abdomen as well. Schedule an appointment with your PCP for follow up. Return to the ER for fever, or new or concerning symptoms.

## 2017-08-05 NOTE — ED Notes (Signed)
Patient transported to CT 

## 2017-08-05 NOTE — ED Provider Notes (Signed)
[facility] EMERGENCY DEPARTMENT Provider Note   CSN: [id number] Arrival date & time: 08/05/17  1008     History   Chief Complaint Chief Complaint  Patient presents with  . Abdominal Pain    HPI Cameron Bradshaw is a 27 y.o. male with past medical history of asthma, hypertension, nephrolithiasis, presenting to the ED with acute onset of gradually worsening lower abdominal pain.  Patient states pain began last night across his lower abdomen, he states throughout the night became more localized to his right lower quadrant.  Pain is worse with any movement or palpation.  He denies associated decreased appetite, nausea or vomiting, or changes in bowel movements.  No history of abdominal surgeries.  Has been taking amoxicillin for dental problem, as well as 800 mg of ibuprofen daily. Not on anticoagulants.  The history is provided by the patient.    Past Medical History:  Diagnosis Date  . Asthma   . Hypertension   . Kidney stones     Patient Active Problem List   Diagnosis Date Noted  . Asthma 02/01/2015  . Allergic rhinitis 02/01/2015  . H/O gastroesophageal reflux (GERD) 02/01/2015  . Tobacco abuse 02/01/2015  . 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 FRACTURE SURGERY          Home Medications    Prior to Admission medications   Medication Sig Start Date End Date Taking? Authorizing Provider  albuterol (PROAIR HFA) 108 (90 Base) MCG/ACT inhaler Inhale 2 puffs into the lungs every 4 (four) hours as needed for wheezing or shortness of breath. Reported on 09/18/2015 Patient not taking: Reported on 04/28/2016 09/18/15   Jessica Priest, MD  amLODipine (NORVASC) 5 MG tablet Take 1 tablet (5 mg total) by mouth daily. 02/19/17   Ward, Chase Picket, PA-C  chlorpheniramine-HYDROcodone (TUSSIONEX PENNKINETIC ER) 10-8 MG/5ML SUER Take 5 mLs by mouth every 12 (twelve) hours as needed for cough. Patient not  taking: Reported on 02/19/2017 04/28/16   Trixie Dredge, PA-C  dicyclomine (BENTYL) 20 MG tablet Take 1 tablet (20 mg total) by mouth 3 (three) times daily as needed (abdominal cramping). 02/19/17   Ward, Chase Picket, PA-C  fluticasone (FLONASE) 50 MCG/ACT nasal spray Use one to two sprays in each nostril once daily as directed. Patient not taking: Reported on 04/28/2016 10/13/15   Jessica Priest, MD  ibuprofen (ADVIL,MOTRIN) 800 MG tablet Take 1 tablet (800 mg total) by mouth every 8 (eight) hours as needed for mild pain or moderate pain. Patient not taking: Reported on 02/19/2017 04/28/16   Trixie Dredge, PA-C  losartan (COZAAR) 50 MG tablet TAKE ONE TABLET ONCE DAILY AS DIRECTED. 02/19/17   Ward, Chase Picket, PA-C  methocarbamol (ROBAXIN) 500 MG tablet Take 1 tablet (500 mg total) by mouth 2 (two) times daily. 08/05/17   Vivianna Piccini, [place] N, PA-C  omeprazole (PRILOSEC) 20 MG capsule Take one tablet by mouth once a day as directed. 10/13/15   Kozlow, Alvira Philips, MD  ondansetron (ZOFRAN ODT) 4 MG disintegrating tablet Take 1 tablet (4 mg total) by mouth every 8 (eight) hours as needed for nausea or vomiting. 02/19/17   Ward, Chase Picket, PA-C  oxyCODONE (ROXICODONE) 5 MG immediate release tablet Take 1 tablet (5 mg total) by mouth every 4 (four) hours as needed for severe pain. 08/05/17   Latwan Luchsinger, [place] N, PA-C  predniSONE (STERAPRED UNI-PAK 21 TAB) 10 MG (21) TBPK tablet Take 1 tablet (10  mg total) by mouth daily. Day 1: take 6 tabs.  Day 2: 5 tabs  Day 3: 4 tabs  Day 4: 3 tabs  Day 5: 2 tabs  Day 6: 1 tab Patient not taking: Reported on 02/19/2017 04/28/16   Trixie Dredge, PA-C    Family History Family History  Problem Relation Age of Onset  . Diabetes Father     Social History Social History   Tobacco Use  . Smoking status: Current Every Day Smoker    Packs/day: 0.50    Types: Cigarettes  . Smokeless tobacco: Never Used  Substance Use Topics  . Alcohol use: Yes    Comment: occ  . Drug use:  No     Allergies   Patient has no known allergies.   Review of Systems Review of Systems  Constitutional: Negative for appetite change, chills and fever.  Gastrointestinal: Positive for abdominal pain. Negative for blood in stool, constipation, diarrhea, nausea and vomiting.  All other systems reviewed and are negative.    Physical Exam Updated Vital Signs BP (!) 184/148   Pulse 82   Temp 98 F (36.7 C) (Oral)   Resp 20   Ht 5\' 6"  (1.676 m)   Wt 95.3 kg (210 lb)   SpO2 96%   BMI 33.89 kg/m   Physical Exam  Constitutional: He appears well-developed and well-nourished.  Non-toxic appearance.  Patient appears very uncomfortable, intermittently moaning with pain clutching right lower quadrant.  HENT:  Head: Normocephalic and atraumatic.  Eyes: Conjunctivae are normal.  Cardiovascular: Normal rate, regular rhythm, normal heart sounds and intact distal pulses.  Pulmonary/Chest: Effort normal and breath sounds normal. No respiratory distress.  Abdominal: Soft. Normal appearance and bowel sounds are normal. He exhibits no distension and no mass. There is tenderness in the right lower quadrant. There is rebound and guarding. There is no rigidity. No hernia.  Positive Rovsings sign  Neurological: He is alert.  Skin: Skin is warm.  Psychiatric: He has a normal mood and affect. His behavior is normal.  Nursing note and vitals reviewed.    ED Treatments / Results  Labs (all labs ordered are listed, but only abnormal results are displayed) Labs Reviewed  COMPREHENSIVE METABOLIC PANEL - Abnormal; Notable for the following components:      Result Value   Glucose, Bld 120 (*)    AST 42 (*)    All other components within normal limits  CBC - Abnormal; Notable for the following components:   WBC 11.6 (*)    RBC 5.99 (*)    Hemoglobin 17.2 (*)    All other components within normal limits  LIPASE, BLOOD  DIFFERENTIAL  URINALYSIS, ROUTINE W REFLEX MICROSCOPIC     EKG None  Radiology Ct Abdomen Pelvis W Contrast  Result Date: 08/05/2017 CLINICAL DATA:  Right lower quadrant pain EXAM: CT ABDOMEN AND PELVIS WITH CONTRAST TECHNIQUE: Multidetector CT imaging of the abdomen and pelvis was performed using the standard protocol following bolus administration of intravenous contrast. CONTRAST:  ISOVUE-300 IOPAMIDOL (ISOVUE-300) INJECTION 61% COMPARISON:  None. FINDINGS: Lower chest: Lung bases are clear. No effusions. Heart is normal size. Hepatobiliary: Suspect mild fatty infiltration of the liver. No focal hepatic abnormality. Gallbladder unremarkable. Pancreas: No focal abnormality or ductal dilatation. Spleen: No focal abnormality.  Normal size. Adrenals/Urinary Tract: Small bilateral renal cysts. No hydronephrosis. No stones. Adrenal glands and urinary bladder unremarkable. Stomach/Bowel: Appendix is normal. Stomach, large and small bowel grossly unremarkable. Vascular/Lymphatic: No evidence of aneurysm or adenopathy.  Reproductive: No visible focal abnormality. Other: No free fluid or free air. There is enlargement of the right rectus muscle compatible with rectus sheath hematoma. This measures approximately 17 cm in craniocaudal length on sagittal image 55 and measures 8.2 x 5.4 cm on axial image 77. High density noted centrally compatible with active contrast extravasation. Musculoskeletal: No acute bony abnormality. IMPRESSION: Right rectus sheath hematoma with dimensions as above. There is central active contrast extravasation noted. Normal appendix. Mild fatty infiltration of the liver. Electronically Signed   By: Charlett NoseKevin  Dover M.D.   On: 08/05/2017 11:09    Procedures Procedures (including critical care time)  Medications Ordered in ED Medications  methocarbamol (ROBAXIN) 1,000 mg in dextrose 5 % 50 mL IVPB (1,000 mg Intravenous New Bag/Given 08/05/17 1531)  sodium chloride 0.9 % bolus 1,000 mL (1,000 mLs Intravenous Given 08/05/17 1043)   HYDROmorphone (DILAUDID) injection 1 mg (1 mg Intravenous Given 08/05/17 1044)  iopamidol (ISOVUE-300) 61 % injection (100 mLs  Contrast Given 08/05/17 1100)  HYDROmorphone (DILAUDID) injection 1 mg (1 mg Intravenous Given 08/05/17 1154)  oxyCODONE-acetaminophen (PERCOCET/ROXICET) 5-325 MG per tablet 1 tablet (1 tablet Oral Given 08/05/17 1152)     Initial Impression / Assessment and Plan / ED Course  I have reviewed the triage vital signs and the nursing notes.  Pertinent labs & imaging results that were available during my care of the patient were reviewed by me and considered in my medical decision making (see chart for details).  Clinical Course as of Aug 06 1547  Tue Aug 05, 2017  1148 Discussed CT results with patient. Will provide more pain medication, as pt reporting continued pain. 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    [JR]    Clinical Course User Index [JR] Micai Apolinar, [place] N, PA-C    Patient presenting to the ED with acute onset of right lower quadrant abdominal pain that began last night.  No nausea or vomiting, no bowel changes, no changes in appetite.  On exam, patient appears uncomfortable, with peritoneal signs and guarding.  Right lower quadrant tenderness.  Pt is afebrile, hypertensive.  Concern for acute appendicitis, CT ordered.    CT abdomen pelvis is neg for acute appendicitis, though showing a right hematoma of the rectus sheath.  Mild leukocytosis of 11.6.  Normal platelets.  CMP unremarkable.  Pain treated in the ED with improvement in sx and BP.  Patient is safe for discharge with symptomatic management. Discussed return precautions.  Patient discussed with Dr. Rosalia Hammersay, who agrees with care plan.  [place] queried  Discussed results, findings, treatment and follow up. Patient advised of return precautions. Patient verbalized understanding  and agreed with plan.   Final Clinical Impressions(s) / ED Diagnoses   Final diagnoses:  Rectus sheath hematoma, initial encounter    ED Discharge Orders        Ordered    oxyCODONE (ROXICODONE) 5 MG immediate release tablet  Every 4 hours PRN     08/05/17 1504    methocarbamol (ROBAXIN) 500 MG tablet  2 times daily     08/05/17 1504       Ica Daye, [place] N, [place]-C 08/05/17 1550    Margarita Grizzleay, Danielle, MD 08/05/17 1640

## 2018-02-11 ENCOUNTER — Encounter (HOSPITAL_COMMUNITY): Payer: Self-pay | Admitting: Emergency Medicine

## 2018-02-11 ENCOUNTER — Inpatient Hospital Stay (HOSPITAL_COMMUNITY)
Admission: EM | Admit: 2018-02-11 | Discharge: 2018-02-13 | DRG: 872 | Disposition: A | Discharge status: Home / Self Care | Payer: Medicaid Other | Attending: Internal Medicine | Admitting: Internal Medicine

## 2018-02-11 ENCOUNTER — Emergency Department (HOSPITAL_COMMUNITY): Payer: Medicaid Other

## 2018-02-11 DIAGNOSIS — K130 Diseases of lips: Secondary | ICD-10-CM | POA: Diagnosis present

## 2018-02-11 DIAGNOSIS — A419 Sepsis, unspecified organism: Principal | ICD-10-CM | POA: Diagnosis present

## 2018-02-11 DIAGNOSIS — Z833 Family history of diabetes mellitus: Secondary | ICD-10-CM

## 2018-02-11 DIAGNOSIS — R1310 Dysphagia, unspecified: Secondary | ICD-10-CM | POA: Diagnosis present

## 2018-02-11 DIAGNOSIS — J029 Acute pharyngitis, unspecified: Secondary | ICD-10-CM | POA: Diagnosis present

## 2018-02-11 DIAGNOSIS — I10 Essential (primary) hypertension: Secondary | ICD-10-CM

## 2018-02-11 DIAGNOSIS — K219 Gastro-esophageal reflux disease without esophagitis: Secondary | ICD-10-CM | POA: Diagnosis present

## 2018-02-11 DIAGNOSIS — E538 Deficiency of other specified B group vitamins: Secondary | ICD-10-CM | POA: Diagnosis present

## 2018-02-11 DIAGNOSIS — J45909 Unspecified asthma, uncomplicated: Secondary | ICD-10-CM | POA: Diagnosis present

## 2018-02-11 DIAGNOSIS — J028 Acute pharyngitis due to other specified organisms: Secondary | ICD-10-CM | POA: Diagnosis present

## 2018-02-11 DIAGNOSIS — N200 Calculus of kidney: Secondary | ICD-10-CM

## 2018-02-11 DIAGNOSIS — Z8719 Personal history of other diseases of the digestive system: Secondary | ICD-10-CM

## 2018-02-11 DIAGNOSIS — F17210 Nicotine dependence, cigarettes, uncomplicated: Secondary | ICD-10-CM | POA: Diagnosis present

## 2018-02-11 DIAGNOSIS — Z79899 Other long term (current) drug therapy: Secondary | ICD-10-CM

## 2018-02-11 DIAGNOSIS — E860 Dehydration: Secondary | ICD-10-CM | POA: Diagnosis present

## 2018-02-11 DIAGNOSIS — R221 Localized swelling, mass and lump, neck: Secondary | ICD-10-CM

## 2018-02-11 LAB — CBC WITH DIFFERENTIAL/PLATELET
BASOS ABS: 0 10*3/uL (ref 0.0–0.1)
BASOS PCT: 0 %
EOS PCT: 0 %
Eosinophils Absolute: 0 10*3/uL (ref 0.0–0.7)
HCT: 45.6 % (ref 39.0–52.0)
Hemoglobin: 15.8 g/dL (ref 13.0–17.0)
LYMPHS PCT: 13 %
Lymphs Abs: 2 10*3/uL (ref 0.7–4.0)
MCH: 28.8 pg (ref 26.0–34.0)
MCHC: 34.6 g/dL (ref 30.0–36.0)
MCV: 83.2 fL (ref 78.0–100.0)
Monocytes Absolute: 1.5 10*3/uL — ABNORMAL HIGH (ref 0.1–1.0)
Monocytes Relative: 10 %
NEUTROS ABS: 11.4 10*3/uL — AB (ref 1.7–7.7)
Neutrophils Relative %: 77 %
Platelets: 287 10*3/uL (ref 150–400)
RBC: 5.48 MIL/uL (ref 4.22–5.81)
RDW: 14.2 % (ref 11.5–15.5)
WBC: 14.8 10*3/uL — AB (ref 4.0–10.5)

## 2018-02-11 LAB — BASIC METABOLIC PANEL
ANION GAP: 12 (ref 5–15)
BUN: 13 mg/dL (ref 6–20)
CO2: 24 mmol/L (ref 22–32)
Calcium: 9.8 mg/dL (ref 8.9–10.3)
Chloride: 108 mmol/L (ref 98–111)
Creatinine, Ser: 1.15 mg/dL (ref 0.61–1.24)
GFR calc Af Amer: >60 mL/min (ref >60)
Glucose, Bld: 92 mg/dL (ref 70–99)
POTASSIUM: 3.5 mmol/L (ref 3.5–5.1)
SODIUM: 144 mmol/L (ref 135–145)

## 2018-02-11 LAB — GROUP A STREP BY PCR: Group A Strep by PCR: NOT DETECTED

## 2018-02-11 LAB — I-STAT CG4 LACTIC ACID, ED: Lactic Acid, Venous: 1.2 mmol/L (ref 0.5–1.9)

## 2018-02-11 LAB — TSH: TSH: 0.878 u[IU]/mL (ref 0.350–4.500)

## 2018-02-11 MED ORDER — CLINDAMYCIN PHOSPHATE 600 MG/50ML IV SOLN
600.0000 mg | Freq: Once | INTRAVENOUS | Status: AC
  Administered 2018-02-11: 600 mg via INTRAVENOUS
  Filled 2018-02-11: qty 50

## 2018-02-11 MED ORDER — ACYCLOVIR 400 MG PO TABS
400.0000 mg | ORAL_TABLET | Freq: Three times a day (TID) | ORAL | Status: DC
  Administered 2018-02-12 – 2018-02-13 (×4): 400 mg via ORAL
  Filled 2018-02-11 (×5): qty 1

## 2018-02-11 MED ORDER — FAMOTIDINE 20 MG PO TABS: 10.0000 mg | ORAL_TABLET | Freq: Every day | ORAL | Status: DC

## 2018-02-11 MED ORDER — ACETAMINOPHEN 500 MG PO TABS
1000.0000 mg | ORAL_TABLET | Freq: Once | ORAL | Status: AC
  Administered 2018-02-11: 1000 mg via ORAL
  Filled 2018-02-11: qty 2

## 2018-02-11 MED ORDER — DEXAMETHASONE SODIUM PHOSPHATE 4 MG/ML IJ SOLN
4.0000 mg | Freq: Three times a day (TID) | INTRAMUSCULAR | Status: DC
  Administered 2018-02-12 – 2018-02-13 (×4): 4 mg via INTRAVENOUS
  Filled 2018-02-11: qty 1
  Filled 2018-02-11: qty 0.4
  Filled 2018-02-11 (×4): qty 1

## 2018-02-11 MED ORDER — ACETAMINOPHEN 325 MG PO TABS: 650.0000 mg | ORAL_TABLET | Freq: Four times a day (QID) | ORAL | Status: DC | PRN

## 2018-02-11 MED ORDER — HYDROCODONE-ACETAMINOPHEN 5-325 MG PO TABS
1.0000 | ORAL_TABLET | ORAL | Status: DC | PRN
  Administered 2018-02-12 – 2018-02-13 (×5): 2 via ORAL
  Filled 2018-02-11 (×5): qty 2

## 2018-02-11 MED ORDER — HYDROMORPHONE HCL 1 MG/ML IJ SOLN
1.0000 mg | Freq: Once | INTRAMUSCULAR | Status: AC
  Administered 2018-02-11: 1 mg via INTRAVENOUS
  Filled 2018-02-11: qty 1

## 2018-02-11 MED ORDER — SODIUM CHLORIDE 0.9 % IV BOLUS
1000.0000 mL | Freq: Once | INTRAVENOUS | Status: AC
  Administered 2018-02-11: 1000 mL via INTRAVENOUS

## 2018-02-11 MED ORDER — HYDROMORPHONE HCL 1 MG/ML IJ SOLN
0.5000 mg | INTRAMUSCULAR | Status: DC | PRN
  Administered 2018-02-11 – 2018-02-13 (×9): 0.5 mg via INTRAVENOUS
  Filled 2018-02-11 (×8): qty 0.5
  Filled 2018-02-11: qty 1

## 2018-02-11 MED ORDER — ONDANSETRON HCL 4 MG PO TABS: 4.0000 mg | ORAL_TABLET | Freq: Four times a day (QID) | ORAL | Status: DC | PRN

## 2018-02-11 MED ORDER — DEXAMETHASONE SODIUM PHOSPHATE 10 MG/ML IJ SOLN
10.0000 mg | Freq: Once | INTRAMUSCULAR | Status: AC
  Administered 2018-02-11: 10 mg via INTRAVENOUS
  Filled 2018-02-11: qty 1

## 2018-02-11 MED ORDER — MAGIC MOUTHWASH
15.0000 mL | Freq: Four times a day (QID) | ORAL | Status: DC | PRN
  Administered 2018-02-11 – 2018-02-12 (×2): 15 mL via ORAL
  Filled 2018-02-11 (×4): qty 15

## 2018-02-11 MED ORDER — SODIUM CHLORIDE 0.9 % IV SOLN
INTRAVENOUS | Status: AC
  Administered 2018-02-11: via INTRAVENOUS

## 2018-02-11 MED ORDER — VALACYCLOVIR HCL 500 MG PO TABS
1000.0000 mg | ORAL_TABLET | Freq: Once | ORAL | Status: AC
  Administered 2018-02-11: 1000 mg via ORAL
  Filled 2018-02-11: qty 2

## 2018-02-11 MED ORDER — CLINDAMYCIN PHOSPHATE 600 MG/50ML IV SOLN
600.0000 mg | Freq: Three times a day (TID) | INTRAVENOUS | Status: DC
  Administered 2018-02-12 – 2018-02-13 (×4): 600 mg via INTRAVENOUS
  Filled 2018-02-11 (×5): qty 50

## 2018-02-11 MED ORDER — MORPHINE SULFATE (PF) 4 MG/ML IV SOLN
4.0000 mg | Freq: Once | INTRAVENOUS | Status: AC
  Administered 2018-02-11: 4 mg via INTRAVENOUS
  Filled 2018-02-11: qty 1

## 2018-02-11 MED ORDER — KETOROLAC TROMETHAMINE 30 MG/ML IJ SOLN
30.0000 mg | Freq: Four times a day (QID) | INTRAMUSCULAR | Status: DC | PRN
  Administered 2018-02-11 – 2018-02-12 (×4): 30 mg via INTRAVENOUS
  Filled 2018-02-11 (×4): qty 1

## 2018-02-11 MED ORDER — IOHEXOL 300 MG/ML  SOLN
75.0000 mL | Freq: Once | INTRAMUSCULAR | Status: AC | PRN
  Administered 2018-02-11: 75 mL via INTRAVENOUS

## 2018-02-11 MED ORDER — LABETALOL HCL 5 MG/ML IV SOLN
5.0000 mg | Freq: Once | INTRAVENOUS | Status: AC
  Administered 2018-02-11: 5 mg via INTRAVENOUS
  Filled 2018-02-11: qty 4

## 2018-02-11 MED ORDER — ONDANSETRON HCL 4 MG/2ML IJ SOLN: 4.0000 mg | Freq: Four times a day (QID) | INTRAMUSCULAR | Status: DC | PRN

## 2018-02-11 MED ORDER — ACETAMINOPHEN 650 MG RE SUPP: 650.0000 mg | Freq: Four times a day (QID) | RECTAL | Status: DC | PRN

## 2018-02-11 NOTE — ED Notes (Signed)
Pt reports taking tylenol, 500mg  at 1300

## 2018-02-11 NOTE — ED Provider Notes (Signed)
[facility]-EMERGENCY DEPT Provider Note   CSN: [id number] Arrival date & time: 02/11/18  1443     History   Chief Complaint Chief Complaint  Patient presents with  . Generalized Body Aches  . Mouth Lesions  . Sore Throat    HPI Cameron Bradshaw is a 27 y.o. male.  He said he started feeling sick about a week ago when he broke out with some cold sores at the corner of his mouth.  About 2 days ago he started with some sores on his tongue and a sore throat and some body aches.  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  They told him that there was also something wrong with his thyroid.  That was not on the paperwork that he came with today.  He is on oral Keflex.  He comes in here today with more difficulty swallowing and a fever to 103 degrees.  No cough no abdominal pain no diarrhea no urinary symptoms no swollen joints or rash.  No sick contacts or recent travel.  The history is provided by the patient.  Sore Throat  This is a new problem. The current episode started yesterday. The problem occurs constantly. The problem has been gradually worsening. Pertinent negatives include no chest pain, no abdominal pain and no shortness of breath. The symptoms are aggravated by swallowing and eating. Nothing relieves the symptoms. He has tried acetaminophen for the symptoms. The treatment provided mild relief.    Past Medical History:  Diagnosis Date  . Asthma   . Hypertension   . Kidney stones     Patient Active Problem List   Diagnosis Date Noted  . Asthma 02/01/2015  . Allergic rhinitis 02/01/2015  . H/O gastroesophageal reflux (GERD) 02/01/2015  . Tobacco abuse 02/01/2015  . 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 FRACTURE SURGERY          Home Medications    Prior to Admission medications   Medication Sig Start Date End Date Taking?  Authorizing Provider  albuterol (PROAIR HFA) 108 (90 Base) MCG/ACT inhaler Inhale 2 puffs into the lungs every 4 (four) hours as needed for wheezing or shortness of breath. Reported on 09/18/2015 Patient not taking: Reported on 04/28/2016 09/18/15   Jessica Priest, MD  amLODipine (NORVASC) 5 MG tablet Take 1 tablet (5 mg total) by mouth daily. 02/19/17   Ward, Chase Picket, PA-C  chlorpheniramine-HYDROcodone (TUSSIONEX PENNKINETIC ER) 10-8 MG/5ML SUER Take 5 mLs by mouth every 12 (twelve) hours as needed for cough. Patient not taking: Reported on 02/19/2017 04/28/16   Trixie Dredge, PA-C  dicyclomine (BENTYL) 20 MG tablet Take 1 tablet (20 mg total) by mouth 3 (three) times daily as needed (abdominal cramping). 02/19/17   Ward, Chase Picket, PA-C  fluticasone (FLONASE) 50 MCG/ACT nasal spray Use one to two sprays in each nostril once daily as directed. Patient not taking: Reported on 04/28/2016 10/13/15   Jessica Priest, MD  ibuprofen (ADVIL,MOTRIN) 800 MG tablet Take 1 tablet (800 mg total) by mouth every 8 (eight) hours as needed for mild pain or moderate pain. Patient not taking: Reported on 02/19/2017 04/28/16   Trixie Dredge, PA-C  losartan (COZAAR) 50 MG tablet TAKE ONE TABLET ONCE DAILY AS DIRECTED. 02/19/17   Ward, Chase Picket, PA-C  methocarbamol (ROBAXIN) 500 MG tablet Take 1 tablet (500 mg total) by mouth 2 (two)  times daily. 08/05/17   Robinson, [place] N, PA-C  omeprazole (PRILOSEC) 20 MG capsule Take one tablet by mouth once a day as directed. 10/13/15   Kozlow, Alvira Philips, MD  ondansetron (ZOFRAN ODT) 4 MG disintegrating tablet Take 1 tablet (4 mg total) by mouth every 8 (eight) hours as needed for nausea or vomiting. 02/19/17   Ward, Chase Picket, PA-C  oxyCODONE (ROXICODONE) 5 MG immediate release tablet Take 1 tablet (5 mg total) by mouth every 4 (four) hours as needed for severe pain. 08/05/17   Robinson, [place] N, PA-C  predniSONE (STERAPRED UNI-PAK 21 TAB) 10 MG (21) TBPK tablet Take 1 tablet  (10 mg total) by mouth daily. Day 1: take 6 tabs.  Day 2: 5 tabs  Day 3: 4 tabs  Day 4: 3 tabs  Day 5: 2 tabs  Day 6: 1 tab Patient not taking: Reported on 02/19/2017 04/28/16   Trixie Dredge, PA-C    Family History Family History  Problem Relation Age of Onset  . Diabetes Father     Social History Social History   Tobacco Use  . Smoking status: Current Every Day Smoker    Packs/day: 0.50    Types: Cigarettes  . Smokeless tobacco: Never Used  Substance Use Topics  . Alcohol use: Yes    Comment: occ  . Drug use: No     Allergies   Patient has no known allergies.   Review of Systems Review of Systems  Constitutional: Positive for chills and fever.  HENT: Positive for mouth sores and sore throat.   Eyes: Negative for visual disturbance.  Respiratory: Negative for shortness of breath.   Cardiovascular: Negative for chest pain.  Gastrointestinal: Negative for abdominal pain and vomiting.  Genitourinary: Negative for dysuria.  Musculoskeletal: Positive for myalgias.  Skin: Negative for rash.  Neurological: Negative for syncope.     Physical Exam Updated Vital Signs BP (!) 162/99 (BP Location: Left Arm)   Pulse (!) 115   Temp (!) 103 F (39.4 C) (Oral)   Resp 18   SpO2 97%   Physical Exam  Constitutional: He appears well-developed and well-nourished.  HENT:  Head: Normocephalic and atraumatic.  Right Ear: Tympanic membrane normal.  Left Ear: Tympanic membrane normal.  Mouth/Throat: Uvula is midline and mucous membranes are normal. Posterior oropharyngeal erythema present. No tonsillar abscesses.  He has some shallow ulcerations on her tongue and a little bit of chelitis at the corner of his mouth.  Eyes: Pupils are equal, round, and reactive to light. Conjunctivae and EOM are normal.  Neck:  He has some tenderness under his mandible in his anterior neck.  Trachea is midline.  Cardiovascular: Regular rhythm. Tachycardia present.  No murmur  heard. Pulmonary/Chest: Effort normal and breath sounds normal. No respiratory distress.  Abdominal: Soft. There is no tenderness.  Musculoskeletal: He exhibits no edema, tenderness or deformity.  Neurological: He is alert.  Skin: Skin is warm and dry. Capillary refill takes less than 2 seconds.  Psychiatric: He has a normal mood and affect.  Nursing note and vitals reviewed.    ED Treatments / Results  Labs (all labs ordered are listed, but only abnormal results are displayed) Labs Reviewed  CBC WITH DIFFERENTIAL/PLATELET - Abnormal; Notable for the following components:      Result Value   WBC 14.8 (*)    Neutro Abs 11.4 (*)    Monocytes Absolute 1.5 (*)    All other components within normal limits  APTT - Abnormal; Notable  for the following components:   aPTT 37 (*)    All other components within normal limits  COMPREHENSIVE METABOLIC PANEL - Abnormal; Notable for the following components:   Glucose, Bld 143 (*)    Calcium 8.8 (*)    All other components within normal limits  CBC - Abnormal; Notable for the following components:   WBC 13.4 (*)    All other components within normal limits  CULTURE, BLOOD (ROUTINE X 2)  CULTURE, BLOOD (ROUTINE X 2)  GROUP A STREP BY PCR  BASIC METABOLIC PANEL  TSH  LACTIC ACID, PLASMA  LACTIC ACID, PLASMA  PROCALCITONIN  PROTIME-INR  MAGNESIUM  PHOSPHORUS  HIV ANTIBODY (ROUTINE TESTING W REFLEX)  HEMOGLOBIN A1C  I-STAT CG4 LACTIC ACID, ED    EKG None  Radiology Ct Soft Tissue Neck W Contrast  Result Date: 02/11/2018 CLINICAL DATA:  Sore throat for a few days, mouth sores and body aches. EXAM: CT NECK WITH CONTRAST TECHNIQUE: Multidetector CT imaging of the neck was performed using the standard protocol following the bolus administration of intravenous contrast. CONTRAST:  75mL OMNIPAQUE IOHEXOL 300 MG/ML  SOLN COMPARISON:  None. FINDINGS: PHARYNX AND LARYNX: Slightly edematous epiglottis and hypopharynx, narrowed RIGHT and  effaced LEFT piriform sinuses. No retropharyngeal effusion. Patent airway. Normal larynx. SALIVARY GLANDS: Normal. THYROID: Normal. LYMPH NODES: 13 mm reniform LEFT level 2 a lymph node. Rounded 11 mm level 3 lymph nodes. VASCULAR: Normal. LIMITED INTRACRANIAL: Normal. VISUALIZED ORBITS: Normal. MASTOIDS AND VISUALIZED PARANASAL SINUSES: Well-aerated. SKELETON: Nonacute.  Tooth 15 dental carie. UPPER CHEST: Lung apices are clear. No superior mediastinal lymphadenopathy. OTHER: Trace submental effusion with mildly thickened platysma and subcutaneous fat stranding. Anterior neck edema extends to the strap muscles. IMPRESSION: 1. Mild pharyngitis including slightly edematous epiglottis. Patent airway. 2. Submental edema extending into anterior neck without focal fluid collections. 3. Mild reactive lymphadenopathy. 4. Acute findings discussed with and reconfirmed by Dr.Tequita Marrs on 02/11/2018 at 7:49 pm. Electronically Signed   By: Awilda Metro M.D.   On: 02/11/2018 19:50    Procedures Procedures (including critical care time)  Medications Ordered in ED Medications  magic mouthwash (15 mLs Oral Given 02/11/18 2059)  clindamycin (CLEOCIN) IVPB 600 mg (600 mg Intravenous New Bag/Given 02/12/18 0527)  dexamethasone (DECADRON) injection 4 mg (4 mg Intravenous Given 02/12/18 0531)  HYDROmorphone (DILAUDID) injection 0.5 mg (0.5 mg Intravenous Given 02/12/18 0323)  acetaminophen (TYLENOL) tablet 650 mg (has no administration in time range)    Or  acetaminophen (TYLENOL) suppository 650 mg (has no administration in time range)  HYDROcodone-acetaminophen (NORCO/VICODIN) 5-325 MG per tablet 1-2 tablet (has no administration in time range)  ondansetron (ZOFRAN) tablet 4 mg (has no administration in time range)    Or  ondansetron (ZOFRAN) injection 4 mg (has no administration in time range)  0.9 %  sodium chloride infusion ( Intravenous Stopped 02/12/18 0750)  ketorolac (TORADOL) 30 MG/ML injection 30 mg  (30 mg Intravenous Given 02/12/18 0542)  acyclovir (ZOVIRAX) tablet 400 mg (has no administration in time range)  lip balm (CARMEX) ointment (has no administration in time range)  0.9 %  sodium chloride infusion (has no administration in time range)  morphine 4 MG/ML injection 4 mg (4 mg Intravenous Given 02/11/18 1825)  sodium chloride 0.9 % bolus 1,000 mL (0 mLs Intravenous Stopped 02/11/18 2006)  acetaminophen (TYLENOL) tablet 1,000 mg (1,000 mg Oral Given 02/11/18 1813)  iohexol (OMNIPAQUE) 300 MG/ML solution 75 mL (75 mLs Intravenous Contrast Given 02/11/18 1926)  clindamycin (  CLEOCIN) IVPB 600 mg (0 mg Intravenous Stopped 02/11/18 2034)  HYDROmorphone (DILAUDID) injection 1 mg (1 mg Intravenous Given 02/11/18 2022)  dexamethasone (DECADRON) injection 10 mg (10 mg Intravenous Given 02/11/18 2035)  valACYclovir (VALTREX) tablet 1,000 mg (1,000 mg Oral Given 02/11/18 2102)  labetalol (NORMODYNE,TRANDATE) injection 5 mg (5 mg Intravenous Given 02/11/18 2349)     Initial Impression / Assessment and Plan / ED Course  I have reviewed the triage vital signs and the nursing notes.  Pertinent labs & imaging results that were available during my care of the patient were reviewed by me and considered in my medical decision making (see chart for details).  Clinical Course as of Feb 12 918  Wed Feb 11, 2018  2023 Discussed with Dr. Merril Abbe from [facility].  Her recommendations would be to start the patient on some Decadron and an antiviral like valacyclovir.  Magic mouthwash for local pain control.  Clear liquid diet.  If the hospitalist would admit the patient she would stop by tomorrow morning and take a look at him.   [MB]  2134 Discussed with Dr. Adela Glimpse from the hospitalist service who will evaluate the patient for admission.   [MB]    Clinical Course User Index [MB] Terrilee Files, MD     Final Clinical Impressions(s) / ED Diagnoses   Final diagnoses:  Pharyngitis, unspecified etiology   Neck swelling    ED Discharge Orders    None       Terrilee Files, MD 02/12/18 [id number]

## 2018-02-11 NOTE — ED Notes (Signed)
ED TO INPATIENT HANDOFF REPORT  Name/Age/Gender Cameron Bradshaw 27 y.o. male  Code Status   Home/SNF/Other Home  Chief Complaint Fever, Swollen lyph nodes, blisters on tongue  Level of Care/Admitting Diagnosis ED Disposition    ED Disposition Condition [facility]: [place] [[id number]]  Level of Care: Med-Surg [16]  Diagnosis: Sepsis [facility]) [[id number]]  Admitting Physician: Toy Baker [[id number]]  Attending Physician: Toy Baker [[id number]]  PT Class (Do Not Modify): Observation [104]  PT Acc Code (Do Not Modify): Observation [10022]       Medical History Past Medical History:  Diagnosis Date  . Asthma   . Hypertension   . Kidney stones     Allergies No Known Allergies  IV Location/Drains/Wounds Patient Lines/Drains/Airways Status   Active Line/Drains/Airways    Name:   Placement date:   Placement time:   Site:   Days:   Peripheral IV 08/05/17 Right Antecubital   08/05/17    1032    Antecubital   190   Peripheral IV 02/11/18 Left Antecubital   02/11/18    1815    Antecubital   less than 1          Labs/Imaging Results for orders placed or performed during the hospital encounter of 02/11/18 (from the past 48 hour(s))  Basic metabolic panel     Status: None   Collection Time: 02/11/18  6:24 PM  Result Value Ref Range   Sodium 144 135 - 145 mmol/L   Potassium 3.5 3.5 - 5.1 mmol/L   Chloride 108 98 - 111 mmol/L   CO2 24 22 - 32 mmol/L   Glucose, Bld 92 70 - 99 mg/dL   BUN 13 6 - 20 mg/dL   Creatinine, Ser 1.15 0.61 - 1.24 mg/dL   Calcium 9.8 8.9 - 10.3 mg/dL   GFR calc non Af Amer >60 >60 mL/min   GFR calc Af Amer >60 >60 mL/min    Comment: (NOTE) The eGFR has been calculated using the CKD EPI equation. This calculation has not been validated in all clinical situations. eGFR's persistently <60 mL/min signify possible Chronic Kidney Disease.    Anion gap 12 5 - 15    Comment: Performed at [facility], [place] [address] [place], [place] [address]  CBC with Differential     Status: Abnormal   Collection Time: 02/11/18  6:24 PM  Result Value Ref Range   WBC 14.8 (H) 4.0 - 10.5 K/uL   RBC 5.48 4.22 - 5.81 MIL/uL   Hemoglobin 15.8 13.0 - 17.0 g/dL   HCT 45.6 39.0 - 52.0 %   MCV 83.2 78.0 - 100.0 fL   MCH 28.8 26.0 - 34.0 pg   MCHC 34.6 30.0 - 36.0 g/dL   RDW 14.2 11.5 - 15.5 %   Platelets 287 150 - 400 K/uL   Neutrophils Relative % 77 %   Neutro Abs 11.4 (H) 1.7 - 7.7 K/uL   Lymphocytes Relative 13 %   Lymphs Abs 2.0 0.7 - 4.0 K/uL   Monocytes Relative 10 %   Monocytes Absolute 1.5 (H) 0.1 - 1.0 K/uL   Eosinophils Relative 0 %   Eosinophils Absolute 0.0 0.0 - 0.7 K/uL   Basophils Relative 0 %   Basophils Absolute 0.0 0.0 - 0.1 K/uL    Comment: Performed at [facility], [place] [address], [place], [place] [address]  TSH     Status: None   Collection Time:  02/11/18  6:24 PM  Result Value Ref Range   TSH 0.878 0.350 - 4.500 uIU/mL    Comment: Performed by a 3rd Generation assay with a functional sensitivity of <=0.01 uIU/mL. Performed at [facility], [place] [address], [place], [place] [address]   I-Stat CG4 Lactic Acid, ED     Status: None   Collection Time: 02/11/18  6:27 PM  Result Value Ref Range   Lactic Acid, Venous 1.20 0.5 - 1.9 mmol/L  Group A Strep by PCR     Status: None   Collection Time: 02/11/18  8:25 PM  Result Value Ref Range   Group A Strep by PCR NOT DETECTED NOT DETECTED    Comment: Performed at [facility] [place] [address], [place], [place] [address]   Ct Soft Tissue Neck W Contrast  Result Date: 02/11/2018 CLINICAL DATA:  Sore throat for a few days, mouth sores and body aches. EXAM: CT NECK WITH CONTRAST TECHNIQUE: Multidetector CT imaging of the neck was performed using the standard protocol following the bolus administration of intravenous contrast. CONTRAST:  25m OMNIPAQUE IOHEXOL  300 MG/ML  SOLN COMPARISON:  None. FINDINGS: PHARYNX AND LARYNX: Slightly edematous epiglottis and hypopharynx, narrowed RIGHT and effaced LEFT piriform sinuses. No retropharyngeal effusion. Patent airway. Normal larynx. SALIVARY GLANDS: Normal. THYROID: Normal. LYMPH NODES: 13 mm reniform LEFT level 2 a lymph node. Rounded 11 mm level 3 lymph nodes. VASCULAR: Normal. LIMITED INTRACRANIAL: Normal. VISUALIZED ORBITS: Normal. MASTOIDS AND VISUALIZED PARANASAL SINUSES: Well-aerated. SKELETON: Nonacute.  Tooth 15 dental carie. UPPER CHEST: Lung apices are clear. No superior mediastinal lymphadenopathy. OTHER: Trace submental effusion with mildly thickened platysma and subcutaneous fat stranding. Anterior neck edema extends to the strap muscles. IMPRESSION: 1. Mild pharyngitis including slightly edematous epiglottis. Patent airway. 2. Submental edema extending into anterior neck without focal fluid collections. 3. Mild reactive lymphadenopathy. 4. Acute findings discussed with and reconfirmed by Dr.MICHAEL BUTLER on 02/11/2018 at 7:49 pm. Electronically Signed   By: CElon AlasM.D.   On: 02/11/2018 19:50    Pending Labs Unresulted Labs (From admission, onward)    Start     Ordered   02/12/18 0500  HIV Antibody (routine testing w rflx)  Tomorrow morning,   R     02/11/18 2142   02/11/18 2216  Lactic acid, plasma  STAT Now then every 3 hours,   STAT     02/11/18 2216   02/11/18 2216  Procalcitonin  STAT,   R     02/11/18 2216   02/11/18 2216  Protime-INR  STAT,   R     02/11/18 2216   02/11/18 2216  APTT  STAT,   R     02/11/18 2216   02/11/18 1759  Culture, blood (routine x 2)  BLOOD CULTURE X 2,   STAT     02/11/18 1800   Signed and Held  Magnesium  Tomorrow morning,   R    Comments:  Call MD if <1.5    Signed and Held   Signed and Held  Phosphorus  Tomorrow morning,   R     Signed and Held   Signed and Held  Comprehensive metabolic panel  Once,   R    Comments:  Cal MD for K<3.5 or >5.0     Signed and Held   Signed and Held  CBC  Once,   R    Comments:  Call for hg <8.0    Signed and Held  Signed and Held  Hemoglobin A1c  Tomorrow morning,   R     Signed and Held          Vitals/Pain Today's Vitals   02/11/18 1843 02/11/18 2007 02/11/18 2046 02/11/18 2213  BP:  (!) 160/100  (!) 169/114  Pulse:  (!) 106  (!) 101  Resp:  16  18  Temp:  100.1 F (37.8 C)    TempSrc:  Oral    SpO2:  97%  96%  PainSc: _0 Isolation Precautions Droplet precaution  Medications Medications  magic mouthwash (15 mLs Oral Given 02/11/18 2059)  clindamycin (CLEOCIN) IVPB 600 mg (has no administration in time range)  dexamethasone (DECADRON) injection 4 mg (has no administration in time range)  HYDROmorphone (DILAUDID) injection 0.5 mg (0.5 mg Intravenous Given 02/11/18 2220)  morphine 4 MG/ML injection 4 mg (4 mg Intravenous Given 02/11/18 1825)  sodium chloride 0.9 % bolus 1,000 mL (0 mLs Intravenous Stopped 02/11/18 2006)  acetaminophen (TYLENOL) tablet 1,000 mg (1,000 mg Oral Given 02/11/18 1813)  iohexol (OMNIPAQUE) 300 MG/ML solution 75 mL (75 mLs Intravenous Contrast Given 02/11/18 1926)  clindamycin (CLEOCIN) IVPB 600 mg (0 mg Intravenous Stopped 02/11/18 2034)  HYDROmorphone (DILAUDID) injection 1 mg (1 mg Intravenous Given 02/11/18 2022)  dexamethasone (DECADRON) injection 10 mg (10 mg Intravenous Given 02/11/18 2035)  valACYclovir (VALTREX) tablet 1,000 mg (1,000 mg Oral Given 02/11/18 2102)    Mobility walks

## 2018-02-11 NOTE — H&P (Signed)
Tysheem Accardo Crenshaw ZOX:[id number] DOB: 11-14-1990 DOA: 02/11/2018     PCP: [facility]  Outpatient Specialists:      Patient arrived to ER on 02/11/18 at 1443  Patient coming from: home Lives  With family    Chief Complaint:  Chief Complaint  Patient presents with  . Generalized Body Aches  . Mouth Lesions  . Sore Throat    HPI: Daniel Ritthaler is a 27 y.o. 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  He has a 28 year old at home who is doing well    regarding pertinent Chronic problems: asthma stable    While in ER: Started on Vatrex The following Work up has been ordered so far:  Orders Placed This Encounter  Procedures  . Culture, blood (routine x 2)  . Group A Strep by PCR  . CT Soft Tissue Neck W Contrast  . Basic metabolic panel  . CBC with Differential  . TSH  . Diet clear liquid Room service appropriate? Yes; Fluid consistency: Thin  . Consult to ear nose and throat  . Consult to hospitalist    Following Medications were ordered in ER: Medications  magic mouthwash (15 mLs Oral Given 02/11/18 2059)  morphine 4 MG/ML injection 4 mg (4 mg Intravenous Given 02/11/18 1825)  sodium chloride 0.9 % bolus 1,000 mL (0 mLs Intravenous Stopped 02/11/18 2006)  acetaminophen (TYLENOL) tablet 1,000 mg (1,000 mg Oral Given 02/11/18 1813)  iohexol (OMNIPAQUE) 300 MG/ML solution 75 mL (75 mLs Intravenous Contrast Given 02/11/18 1926)  clindamycin (CLEOCIN) IVPB 600 mg (0 mg  Intravenous Stopped 02/11/18 2034)  HYDROmorphone (DILAUDID) injection 1 mg (1 mg Intravenous Given 02/11/18 2022)  dexamethasone (DECADRON) injection 10 mg (10 mg Intravenous Given 02/11/18 2035)  valACYclovir (VALTREX) tablet 1,000 mg (1,000 mg Oral Given 02/11/18 2102)    Significant initial  Findings: Abnormal Labs Reviewed  CBC WITH DIFFERENTIAL/PLATELET - Abnormal; Notable for the following components:      Result Value   WBC 14.8 (*)    Neutro Abs 11.4 (*)    Monocytes Absolute 1.5 (*)    All other components within normal limits     Na 144 K 3.5  Cr   Up from baseline see below Lab Results  Component Value Date   CREATININE 1.15 02/11/2018   CREATININE 0.88 08/05/2017   CREATININE 1.03 02/18/2017   TSH 0.878  WBC 14.8 absolute neutrophil count 14.4 lymphocyte 2.0 no absolute 1.5 eosinophils 0  HG/HCT  stable,      Component Value Date/Time   HGB 15.8 02/11/2018 1824   HGB 17.0 10/12/2015 1623   HCT 45.6 02/11/2018 1824   HCT 48.3 10/12/2015 1623     Lactic Acid, Venous    Component Value Date/Time   LATICACIDVEN 1.20 02/11/2018 1827      UA not ordered   CT neck slightly edematous epiglottis and hypopharynx narrowed right in the face left piriform sinuses  no retropharyngeal effusion      ECG: not obtained   ED Triage Vitals  Enc Vitals Group     BP 02/11/18 1452 (!) 146/95     Pulse Rate 02/11/18 1452 80     Resp 02/11/18 1452 18     Temp 02/11/18 1452 (!) 100.6 F (38.1 C)     Temp Source 02/11/18 1452 Oral     SpO2 02/11/18 1452 99 %     Weight --      Height --      Head Circumference --      Peak Flow --      Pain Score 02/11/18 1749 9     Pain Loc --      Pain Edu? --      Excl. in GC? --   TMAX(24)@       Latest  Blood pressure (!) 160/100, pulse (!) 106, temperature 100.1 F (37.8 C), temperature source Oral, resp. rate 16, SpO2 97 %.   ER Provider Called:  ENT   Dr.Marcellino They Recommend admit to medicine, decadron  Clindamycin magic mouthwash Will see in AM   Hospitalist was called for admission for [facility]   Review of Systems:    Pertinent positives include:  Fevers, chills, fatigue,  Mouth ulcers Constitutional:  No weight loss, night sweats,weight loss  HEENT:  No headaches, Difficulty swallowing,Tooth/dental problems,Sore throat,  No sneezing, itching, ear ache, nasal congestion, post nasal drip,  Cardio-vascular:  No chest pain, Orthopnea, PND, anasarca, dizziness, palpitations.no Bilateral lower extremity swelling  GI:  No heartburn, indigestion, abdominal pain, nausea, vomiting, diarrhea, change in bowel habits, loss of appetite, melena, blood in stool, hematemesis Resp:  no shortness of breath at rest. No dyspnea on exertion, No excess mucus, no productive cough, No non-productive cough, No coughing up of blood.No change in color of mucus.No wheezing. Skin:  no rash or lesions. No jaundice GU:  no dysuria, change in color of urine, no urgency or frequency. No straining to urinate.  No flank pain.  Musculoskeletal:  No joint pain or no joint swelling. No decreased range of motion. No back pain.  Psych:  No change in mood or affect. No depression or anxiety. No memory loss.  Neuro: no localizing neurological complaints, no tingling, no weakness, no double vision, no gait abnormality, no slurred speech, no confusion  All systems reviewed and apart from HOPI all are negative  Past Medical History:   Past Medical History:  Diagnosis Date  . Asthma   . Hypertension   . Kidney stones        Past Surgical History:  Procedure Laterality Date  . FRACTURE SURGERY      Social History:  Ambulatory  independently      reports that he has been smoking cigarettes. He has been smoking about 0.50 packs per day. He has never used smokeless tobacco. He reports that he drinks alcohol. He reports that he does not use drugs.     Family History:   Family History  Problem Relation Age  of Onset  . Diabetes Father     Allergies: No Known Allergies   Prior to Admission medications   Medication Sig Start Date End Date Taking? Authorizing Provider  amLODipine (NORVASC) 5 MG tablet Take 1 tablet (5 mg total) by mouth daily. 02/19/17  Yes Ward, Chase Picket, PA-C  cephALEXin (KEFLEX) 250 MG capsule Take 250 mg by mouth 4 (four) times daily.   Yes [provider]  omeprazole (  PRILOSEC) 20 MG capsule Take one tablet by mouth once a day as directed. 10/13/15  Yes Kozlow, Alvira Philips, MD  ranitidine (ZANTAC) 150 MG tablet Take 150 mg by mouth 2 (two) times daily as needed for heartburn.   Yes [provider]  albuterol (PROAIR HFA) 108 (90 Base) MCG/ACT inhaler Inhale 2 puffs into the lungs every 4 (four) hours as needed for wheezing or shortness of breath. Reported on 09/18/2015 Patient not taking: Reported on 04/28/2016 09/18/15   Jessica Priest, MD  chlorpheniramine-HYDROcodone [facility] PENNKINETIC ER) 10-8 MG/5ML SUER Take 5 mLs by mouth every 12 (twelve) hours as needed for cough. Patient not taking: Reported on 02/19/2017 04/28/16   Trixie Dredge, PA-C  dicyclomine (BENTYL) 20 MG tablet Take 1 tablet (20 mg total) by mouth 3 (three) times daily as needed (abdominal cramping). Patient not taking: Reported on 02/11/2018 02/19/17   Ward, Chase Picket, PA-C  fluticasone [facility]) 50 MCG/ACT nasal spray Use one to two sprays in each nostril once daily as directed. Patient not taking: Reported on 04/28/2016 10/13/15   Jessica Priest, MD  ibuprofen (ADVIL,MOTRIN) 800 MG tablet Take 1 tablet (800 mg total) by mouth every 8 (eight) hours as needed for mild pain or moderate pain. Patient not taking: Reported on 02/19/2017 04/28/16   Trixie Dredge, PA-C  losartan (COZAAR) 50 MG tablet TAKE ONE TABLET ONCE DAILY AS DIRECTED. Patient not taking: Reported on 02/11/2018 02/19/17   Ward, Chase Picket, PA-C  methocarbamol (ROBAXIN) 500 MG tablet Take 1 tablet (500 mg total) by mouth 2  (two) times daily. Patient not taking: Reported on 02/11/2018 08/05/17   Robinson, [place] N, PA-C  ondansetron (ZOFRAN ODT) 4 MG disintegrating tablet Take 1 tablet (4 mg total) by mouth every 8 (eight) hours as needed for nausea or vomiting. Patient not taking: Reported on 02/11/2018 02/19/17   Ward, Chase Picket, PA-C  oxyCODONE (ROXICODONE) 5 MG immediate release tablet Take 1 tablet (5 mg total) by mouth every 4 (four) hours as needed for severe pain. Patient not taking: Reported on 02/11/2018 08/05/17   Robinson, [place] N, PA-C  predniSONE (STERAPRED UNI-PAK 21 TAB) 10 MG (21) TBPK tablet Take 1 tablet (10 mg total) by mouth daily. Day 1: take 6 tabs.  Day 2: 5 tabs  Day 3: 4 tabs  Day 4: 3 tabs  Day 5: 2 tabs  Day 6: 1 tab Patient not taking: Reported on 02/19/2017 04/28/16   Trixie Dredge, PA-C   Physical Exam: Blood pressure (!) 160/100, pulse (!) 106, temperature 100.1 F (37.8 C), temperature source Oral, resp. rate 16, SpO2 97 %. 1. General:  in No Acute distress  well  -appearing 2. Psychological: Alert and  Oriented 3. Head/ENT:   Dry Mucous Membranes                          Head Non traumatic, neck supple                          Normal  Dentition                            Swelling and erythema of anterior neck                          Stomatitis present  Ulcerations at the corners of the mouth                         Seborrheic dermatitis 4. SKIN:  decreased Skin turgor,  Skin clean Dry and intact no rash 5. Heart: Regular rate and rhythm no Murmur, no Rub or gallop 6. Lungs: clear to auscultation bilaterally, no wheezes or crackles   7. Abdomen: Soft, non-tender, Non distended bowel sounds present 8. Lower extremities: no clubbing, cyanosis, or  edema 9. Neurologically Grossly intact, moving all 4 extremities equally  10. MSK: Normal range of motion   LABS:     Recent Labs  Lab 02/11/18 1824  WBC 14.8*  NEUTROABS 11.4*  HGB 15.8  HCT 45.6    MCV 83.2  PLT 287   Basic Metabolic Panel: Recent Labs  Lab 02/11/18 1824  NA 144  K 3.5  CL 108  CO2 24  GLUCOSE 92  BUN 13  CREATININE 1.15  CALCIUM 9.8      No results for input(s): AST, ALT, ALKPHOS, BILITOT, PROT, ALBUMIN in the last 168 hours. No results for input(s): LIPASE, AMYLASE in the last 168 hours. No results for input(s): AMMONIA in the last 168 hours.    HbA1C: No results for input(s): HGBA1C in the last 72 hours. CBG: No results for input(s): GLUCAP in the last 168 hours.    Urine analysis:    Component Value Date/Time   COLORURINE YELLOW 02/18/2017 2129   APPEARANCEUR CLEAR 02/18/2017 2129   APPEARANCEUR Clear 10/12/2015 1623   LABSPEC 1.023 02/18/2017 2129   PHURINE 5.0 02/18/2017 2129   GLUCOSEU NEGATIVE 02/18/2017 2129   HGBUR NEGATIVE 02/18/2017 2129   BILIRUBINUR NEGATIVE 02/18/2017 2129   BILIRUBINUR Negative 10/12/2015 1623   KETONESUR NEGATIVE 02/18/2017 2129   PROTEINUR NEGATIVE 02/18/2017 2129   UROBILINOGEN 0.2 12/15/2011 1409   UROBILINOGEN 0.2 11/27/2011 1654   NITRITE NEGATIVE 02/18/2017 2129   LEUKOCYTESUR NEGATIVE 02/18/2017 2129   LEUKOCYTESUR Negative 10/12/2015 1623       Cultures: No results found for: SDES, SPECREQUEST, CULT, REPTSTATUS   Radiological Exams on Admission: Ct Soft Tissue Neck W Contrast  Result Date: 02/11/2018 CLINICAL DATA:  Sore throat for a few days, mouth sores and body aches. EXAM: CT NECK WITH CONTRAST TECHNIQUE: Multidetector CT imaging of the neck was performed using the standard protocol following the bolus administration of intravenous contrast. CONTRAST:  75mL OMNIPAQUE IOHEXOL 300 MG/ML  SOLN COMPARISON:  None. FINDINGS: PHARYNX AND LARYNX: Slightly edematous epiglottis and hypopharynx, narrowed RIGHT and effaced LEFT piriform sinuses. No retropharyngeal effusion. Patent airway. Normal larynx. SALIVARY GLANDS: Normal. THYROID: Normal. LYMPH NODES: 13 mm reniform LEFT level 2 a lymph node.  Rounded 11 mm level 3 lymph nodes. VASCULAR: Normal. LIMITED INTRACRANIAL: Normal. VISUALIZED ORBITS: Normal. MASTOIDS AND VISUALIZED PARANASAL SINUSES: Well-aerated. SKELETON: Nonacute.  Tooth 15 dental carie. UPPER CHEST: Lung apices are clear. No superior mediastinal lymphadenopathy. OTHER: Trace submental effusion with mildly thickened platysma and subcutaneous fat stranding. Anterior neck edema extends to the strap muscles. IMPRESSION: 1. Mild pharyngitis including slightly edematous epiglottis. Patent airway. 2. Submental edema extending into anterior neck without focal fluid collections. 3. Mild reactive lymphadenopathy. 4. Acute findings discussed with and reconfirmed by Dr.MICHAEL BUTLER on 02/11/2018 at 7:49 pm. Electronically Signed   By: Awilda Metro M.D.   On: 02/11/2018 19:50    Chart has been reviewed    Assessment/Plan   27 y.o. male with medical history significant  of HTN, kidney stones, asthma Admitted for Sepsis due to pharyngitis  Present on Admission: . Sepsis (HCC) likely secondary to underlying infectious process we will rehydrate continue IV antibiotics await results of blood cultures . Pharyngitis continue clindamycin and supportive management with IV fluids Decadron and Magic mouthwash ENT aware will see in consult in a.m. . Vitamin B 12 deficiency - will replace, avoid PPI,  Herpetic lesions -she denies prior history of cold sores suggesting primary herpetic infection could be on differential could present in this way We will check HIV status Initiate acyclovir 400 3 times daily Dehydration we will rehydrate and follow   Other plan as per orders.  DVT prophylaxis:  SCD    Code Status:  FULL CODE  as per patient   I had personally discussed CODE STATUS with patient   Family Communication:   Family not  at  Bedside    Disposition Plan:     To home once workup is complete and patient is stable                     Consults called: ENT   Admission status:       Obs    Level of care       medical floor              Therisa Doyne 02/11/2018, 11:36 PM    [facility]  Pager [phone]   after 2 AM please page floor coverage PA If 7AM-7PM, please contact the day team taking care of the patient  [email or web address]  Password [email or web address]

## 2018-02-11 NOTE — ED Triage Notes (Addendum)
Pt c/o sore throat, sores on mouth, and body aches for couple days.  Lab results on paper brought with patient reports Herpes. Reports has little cough reports that he is on antibiotics for the mouth lesions

## 2018-02-11 NOTE — Progress Notes (Signed)
Pharmacy Antibiotic Note  Cameron Bradshaw is a 27 y.o. 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  Pharmacy has been consulted for acyclovir dosing.  Plan: Acyclovir 400 mg po three times daily Rx will sign off as no further adjustment needed  Height: 5\' 6"  (167.6 cm) Weight: 210 lb 8.6 oz (95.5 kg) IBW/kg (Calculated) : 63.8  Temp (24hrs), Avg:101 F (38.3 C), Min:100.1 F (37.8 C), Max:103 F (39.4 C)  Recent Labs  Lab 02/11/18 1824 02/11/18 1827  WBC 14.8*  --   CREATININE 1.15  --   LATICACIDVEN  --  1.20    Estimated Creatinine Clearance: 103.5 mL/min (by C-G formula based on SCr of 1.15 mg/dL).    No Known Allergies  Antimicrobials this admission: 10/2 valtrex >> x1 ED 10/3 acyclovir >>   Dose adjustments this admission:   Microbiology results:  BCx:   UCx:    Sputum:    MRSA PCR:   Thank you for allowing pharmacy to be a part of this patient's care.  Lorenza Evangelist 02/11/2018 11:51 PM

## 2018-02-12 ENCOUNTER — Other Ambulatory Visit: Payer: Self-pay

## 2018-02-12 DIAGNOSIS — E538 Deficiency of other specified B group vitamins: Secondary | ICD-10-CM | POA: Diagnosis present

## 2018-02-12 DIAGNOSIS — K219 Gastro-esophageal reflux disease without esophagitis: Secondary | ICD-10-CM | POA: Diagnosis present

## 2018-02-12 DIAGNOSIS — J45909 Unspecified asthma, uncomplicated: Secondary | ICD-10-CM | POA: Diagnosis present

## 2018-02-12 DIAGNOSIS — E860 Dehydration: Secondary | ICD-10-CM | POA: Diagnosis present

## 2018-02-12 DIAGNOSIS — R1310 Dysphagia, unspecified: Secondary | ICD-10-CM | POA: Diagnosis present

## 2018-02-12 DIAGNOSIS — K130 Diseases of lips: Secondary | ICD-10-CM | POA: Diagnosis present

## 2018-02-12 DIAGNOSIS — Z833 Family history of diabetes mellitus: Secondary | ICD-10-CM | POA: Diagnosis not present

## 2018-02-12 DIAGNOSIS — Z8719 Personal history of other diseases of the digestive system: Secondary | ICD-10-CM | POA: Diagnosis not present

## 2018-02-12 DIAGNOSIS — F17210 Nicotine dependence, cigarettes, uncomplicated: Secondary | ICD-10-CM | POA: Diagnosis present

## 2018-02-12 DIAGNOSIS — N200 Calculus of kidney: Secondary | ICD-10-CM | POA: Diagnosis not present

## 2018-02-12 DIAGNOSIS — J029 Acute pharyngitis, unspecified: Secondary | ICD-10-CM | POA: Diagnosis not present

## 2018-02-12 DIAGNOSIS — I10 Essential (primary) hypertension: Secondary | ICD-10-CM | POA: Diagnosis present

## 2018-02-12 DIAGNOSIS — J028 Acute pharyngitis due to other specified organisms: Secondary | ICD-10-CM | POA: Diagnosis present

## 2018-02-12 DIAGNOSIS — Z79899 Other long term (current) drug therapy: Secondary | ICD-10-CM | POA: Diagnosis not present

## 2018-02-12 DIAGNOSIS — A419 Sepsis, unspecified organism: Secondary | ICD-10-CM | POA: Diagnosis present

## 2018-02-12 LAB — COMPREHENSIVE METABOLIC PANEL
ALBUMIN: 3.8 g/dL (ref 3.5–5.0)
ALT: 23 U/L (ref 0–44)
ANION GAP: 10 (ref 5–15)
AST: 16 U/L (ref 15–41)
Alkaline Phosphatase: 39 U/L (ref 38–126)
BILIRUBIN TOTAL: 0.5 mg/dL (ref 0.3–1.2)
BUN: 13 mg/dL (ref 6–20)
CO2: 23 mmol/L (ref 22–32)
Calcium: 8.8 mg/dL — ABNORMAL LOW (ref 8.9–10.3)
Chloride: 107 mmol/L (ref 98–111)
Creatinine, Ser: 1.02 mg/dL (ref 0.61–1.24)
GFR calc Af Amer: >60 mL/min (ref >60)
Glucose, Bld: 143 mg/dL — ABNORMAL HIGH (ref 70–99)
POTASSIUM: 4.2 mmol/L (ref 3.5–5.1)
Sodium: 140 mmol/L (ref 135–145)
TOTAL PROTEIN: 7.3 g/dL (ref 6.5–8.1)

## 2018-02-12 LAB — APTT: APTT: 37 s — AB (ref 24–36)

## 2018-02-12 LAB — CBC
HEMATOCRIT: 42.5 % (ref 39.0–52.0)
HEMOGLOBIN: 14.3 g/dL (ref 13.0–17.0)
MCH: 28.1 pg (ref 26.0–34.0)
MCHC: 33.6 g/dL (ref 30.0–36.0)
MCV: 83.7 fL (ref 78.0–100.0)
Platelets: 265 10*3/uL (ref 150–400)
RBC: 5.08 MIL/uL (ref 4.22–5.81)
RDW: 14.4 % (ref 11.5–15.5)
WBC: 13.4 10*3/uL — AB (ref 4.0–10.5)

## 2018-02-12 LAB — PROTIME-INR
INR: 0.97
PROTHROMBIN TIME: 12.8 s (ref 11.4–15.2)

## 2018-02-12 LAB — LACTIC ACID, PLASMA
Lactic Acid, Venous: 0.9 mmol/L (ref 0.5–1.9)
Lactic Acid, Venous: 1.1 mmol/L (ref 0.5–1.9)

## 2018-02-12 LAB — HIV ANTIBODY (ROUTINE TESTING W REFLEX): HIV Screen 4th Generation wRfx: NONREACTIVE

## 2018-02-12 LAB — MAGNESIUM: MAGNESIUM: 2.1 mg/dL (ref 1.7–2.4)

## 2018-02-12 LAB — PHOSPHORUS: Phosphorus: 4.2 mg/dL (ref 2.5–4.6)

## 2018-02-12 LAB — PROCALCITONIN

## 2018-02-12 MED ORDER — MENTHOL 3 MG MT LOZG
1.0000 | LOZENGE | OROMUCOSAL | Status: DC | PRN
  Administered 2018-02-12: 3 mg via ORAL
  Filled 2018-02-12: qty 9

## 2018-02-12 MED ORDER — SODIUM CHLORIDE 0.9 % IV SOLN: INTRAVENOUS | Status: DC | PRN

## 2018-02-12 MED ORDER — LIP MEDEX EX OINT: TOPICAL_OINTMENT | CUTANEOUS | Status: DC | PRN

## 2018-02-12 NOTE — Consult Note (Signed)
[facility] OTOLARYNGOLOGY CONSULTATION   Primary Care Physician: Patient, No Pcp Per Patient Location at Initial Consult: Inpatient Chief Complaint/Reason for Consult: throat pain  History of Presenting Illness:  History obtained from EMR, patient. Cameron Bradshaw is a  27 y.o. male presenting with  Left angular chelitis, mouth ulcers, throat pain, dysphagia. CT demonstrated questionnable swelling on epiglottis. No abscess, no odontogenic source. Began with sore at left angle of mouth one week ago. Saw PCP. Tested positive for staph and strep reportedly. Then developed sores on tongue and increasingly worse throat pain. No shortness of breath. Told he may have a herpes virus. Given keflex for angular chelitis. Denies rash on palms or soles. No sick contacts, though does have 28 yo at home. Tolerating secretions. Notes severe pain with eating and swallowing. Given IV decadron, PO acyclovir, clinda in ER last night.     Past Medical History:  Diagnosis Date  . Asthma   . Hypertension   . Kidney stones     Past Surgical History:  Procedure Laterality Date  . FRACTURE SURGERY      Family History  Problem Relation Age of Onset  . Diabetes Father     Social History   Socioeconomic History  . Marital status: Single    Spouse name: Not on file  . Number of children: Not on file  . Years of education: Not on file  . Highest education level: Not on file  Occupational History  . Not on file  Social Needs  . Financial resource strain: Not on file  . Food insecurity:    Worry: Not on file    Inability: Not on file  . Transportation needs:    Medical: Not on file    Non-medical: Not on file  Tobacco Use  . Smoking status: Current Every Day Smoker    Packs/day: 0.50    Types: Cigarettes  . Smokeless tobacco: Never Used  Substance and Sexual Activity  . Alcohol use: Yes    Comment: occ  . Drug use: No  . Sexual activity: Not on file  Lifestyle  .  Physical activity:    Days per week: Not on file    Minutes per session: Not on file  . Stress: Not on file  Relationships  . Social connections:    Talks on phone: Not on file    Gets together: Not on file    Attends religious service: Not on file    Active member of club or organization: Not on file    Attends meetings of clubs or organizations: Not on file    Relationship status: Not on file  Other Topics Concern  . Not on file  Social History Narrative  . Not on file    No current facility-administered medications on file prior to encounter.    Current Outpatient Medications on File Prior to Encounter  Medication Sig Dispense Refill  . amLODipine (NORVASC) 5 MG tablet Take 1 tablet (5 mg total) by mouth daily. 30 tablet 1  . cephALEXin (KEFLEX) 250 MG capsule Take 250 mg by mouth 4 (four) times daily.    Marland Kitchen omeprazole (PRILOSEC) 20 MG capsule Take one tablet by mouth once a day as directed. 30 capsule 5  . ranitidine (ZANTAC) 150 MG tablet Take 150 mg by mouth 2 (two) times daily as needed for heartburn.    Marland Kitchen albuterol (PROAIR HFA) 108 (90 Base) MCG/ACT inhaler Inhale 2 puffs into the lungs every 4 (four)  hours as needed for wheezing or shortness of breath. Reported on 09/18/2015 (Patient not taking: Reported on 04/28/2016) 1 Inhaler 0  . chlorpheniramine-HYDROcodone (TUSSIONEX PENNKINETIC ER) 10-8 MG/5ML SUER Take 5 mLs by mouth every 12 (twelve) hours as needed for cough. (Patient not taking: Reported on 02/19/2017) 100 mL 0  . dicyclomine (BENTYL) 20 MG tablet Take 1 tablet (20 mg total) by mouth 3 (three) times daily as needed (abdominal cramping). (Patient not taking: Reported on 02/11/2018) 21 tablet 0  . fluticasone (FLONASE) 50 MCG/ACT nasal spray Use one to two sprays in each nostril once daily as directed. (Patient not taking: Reported on 04/28/2016) 16 g 5  . ibuprofen (ADVIL,MOTRIN) 800 MG tablet Take 1 tablet (800 mg total) by mouth every 8 (eight) hours as needed for mild  pain or moderate pain. (Patient not taking: Reported on 02/19/2017) 15 tablet 0  . losartan (COZAAR) 50 MG tablet TAKE ONE TABLET ONCE DAILY AS DIRECTED. (Patient not taking: Reported on 02/11/2018) 30 tablet 3  . methocarbamol (ROBAXIN) 500 MG tablet Take 1 tablet (500 mg total) by mouth 2 (two) times daily. (Patient not taking: Reported on 02/11/2018) 14 tablet 0  . ondansetron (ZOFRAN ODT) 4 MG disintegrating tablet Take 1 tablet (4 mg total) by mouth every 8 (eight) hours as needed for nausea or vomiting. (Patient not taking: Reported on 02/11/2018) 12 tablet 0  . oxyCODONE (ROXICODONE) 5 MG immediate release tablet Take 1 tablet (5 mg total) by mouth every 4 (four) hours as needed for severe pain. (Patient not taking: Reported on 02/11/2018) 12 tablet 0  . predniSONE (STERAPRED UNI-PAK 21 TAB) 10 MG (21) TBPK tablet Take 1 tablet (10 mg total) by mouth daily. Day 1: take 6 tabs.  Day 2: 5 tabs  Day 3: 4 tabs  Day 4: 3 tabs  Day 5: 2 tabs  Day 6: 1 tab (Patient not taking: Reported on 02/19/2017) 21 tablet 0    No Known Allergies   Review of Systems: ROS complete and negative except for the above mentioned, as well as +fever, +chills, +malaise   OBJECTIVE: Vital Signs: Vitals:   02/11/18 2300 02/12/18 0510  BP: (!) 157/107 123/80  Pulse: (!) 103 68  Resp: 20 18  Temp: 100.1 F (37.8 C) (!) 97.5 F (36.4 C)  SpO2: 96% 100%    I&O  Intake/Output Summary (Last 24 hours) at 02/12/2018 0758 Last data filed at 02/12/2018 0300 Gross per 24 hour  Intake 1372.42 ml  Output -  Net 1372.42 ml    Physical Exam General: Well developed, well nourished. No acute distress. Voice strong, no dysphonia  Head/Face: Normocephalic, atraumatic. No scars or lesions. No sinus tenderness. Facial nerve intact and equal bilaterally.  No facial lacerations. Salivary glands non tender and without palpable masses  Eyes: Globes well positioned, no proptosis Lids: No periorbital edema/ecchymosis. No lid  laceration Conjunctiva: No chemosis, hemorrhage PERRL  Ears: No gross deformity. Normal external canal.   Hearing:  Normal speech reception.  Nose: No gross deformity or lesions. No purulent discharge. Septum midline. No turbinate hypertrophy.  Mouth/Oropharynx: +left scab and angular chelitis makes mouth opening difficult. Some small punctate ulcers on tip of tongue and right dorsum of tongue.  Dentition good. FOM is soft. No mucosal lesions within the oropharynx.   Pharyngeal walls symmetrical. Uvula midline. Tongue midline without lesions. Tonsils mildly edematous, no ulcerations or exudates.   Neck: Trachea midline. No masses. No thyromegaly or nodules palpated. No crepitus.  Lymphatic: No  lymphadenopathy in the neck. Submental region is soft, no overlying erythema  Respiratory: No stridor or distress.  Cardiovascular: Regular rate and rhythm.  Extremities: No edema or cyanosis. Warm and well-perfused.  Skin: No scars or lesions on face or neck.  Neurologic: CN II-XII intact. Moving all extremities without gross abnormality.  Other:      Labs: Lab Results  Component Value Date   WBC 13.4 (H) 02/12/2018   HGB 14.3 02/12/2018   HCT 42.5 02/12/2018   PLT 265 02/12/2018   ALT 23 02/12/2018   AST 16 02/12/2018   NA 140 02/12/2018   K 4.2 02/12/2018   CL 107 02/12/2018   CREATININE 1.02 02/12/2018   BUN 13 02/12/2018   CO2 23 02/12/2018   TSH 0.878 02/11/2018   INR 0.97 02/12/2018     Review of Ancillary Data / Diagnostic Tests: CT neck (personally reviewed) demonstrates normal epiglottis, patent airway. Mild bilateral cervical adenopathy, submental fat stranding. No obvious periapical lucencies. No abscesses.       Procedure: Transnasal Flexible Laryngoscopy  Preoperative Diagnosis: dysphagia, fever Postoperative Diagnosis: same Procedure: Transnasal fiberoptic laryngoscopy.  Estimated Blood Loss: 0 mL.  Complications: None.  Findings: The nasal cavity and  nasopharynx are unremarkable. There are no suspicious findings in the nasopharynx or Fossa of Rosenmller. The tongue base, pharyngeal walls, piriform sinuses, vallecula, epiglottis and postcricoid region are normal in appearance. The visualized portion of the subglottis and proximal trachea is widely patent. The vocal folds are mobile bilaterally. There are no lesions or significant inflammation. There is no glottal insufficiency. There is no pooling of secretions or aspiration. No ulcerations on epiglottis. No obvious clinical reason to explain patient's severe throat pain.   Description of Procedure: With the patient in the sitting position, topical  Afrin-lidocaine mixture in an atomizer was applied to the nose. The scope was passed through the nose. Examination was carried out of the nose, nasopharynx, oropharynx, hypopharynx, and larynx with findings as noted above. Scope was removed.  The patient tolerated the procedure well.     ASSESSMENT:  27 y.o. male with febrile viral type illness in the setting of a superinfected left lip ulceration. Scope examination did not demonstrate any abnormality at base of tongue or epiglottis.   RECOMMENDATIONS: -Continue Magic mouthwash -Continue abx and antivirals per primary team -Advance diet as tolerated -May try chloraseptic throat spray -Topical mupricin for left angle of mouth  Follow up with PCP as outpatient.     Misty Stanley, MD  [facility] Office phone [phone]

## 2018-02-12 NOTE — Progress Notes (Signed)
PROGRESS NOTE    Cameron Bradshaw   ZOX:[id number]  DOB: 1990-12-27  DOA: 02/11/2018 PCP: Patient, No Pcp Per   Brief Narrative:  Cameron Bradshaw with HTN, asthma, renal stones admitted for mouth sore, throat pain who has failed outpt Keflex. He presented with a fever 103 and swelling in the throat as well.    Subjective: Continues to have pain in mouth and throat ROS: no complaints of nausea, vomiting, constipation diarrhea, cough, dyspnea or dysuria. No other complaints.   Assessment & Plan:   Active Problems:   Sepsis     Pharyngitis - cont Acyclovir, Decadron, Toradol, Clindamycin   Asthma - stable  HTN - Cozaar on hold  DVT prophylaxis: SCDs Code Status: Full code Family Communication:  Disposition Plan: home when stable Consultants:    Procedures:    Antimicrobials:  Anti-infectives (From admission, onward)   Start     Dose/Rate Route Frequency Ordered Stop   02/12/18 1000  acyclovir (ZOVIRAX) tablet 400 mg     400 mg Oral 3 times daily 02/11/18 2354     02/12/18 0600  clindamycin (CLEOCIN) IVPB 600 mg     600 mg 100 mL/hr over 30 Minutes Intravenous Every 8 hours 02/11/18 2216     02/11/18 2100  valACYclovir (VALTREX) tablet 1,000 mg     1,000 mg Oral  Once 02/11/18 2052 02/11/18 2102   02/11/18 2000  clindamycin (CLEOCIN) IVPB 600 mg     600 mg 100 mL/hr over 30 Minutes Intravenous  Once 02/11/18 1950 02/11/18 2034       Objective: Vitals:   02/11/18 2300 02/12/18 0510 02/12/18 1000 02/12/18 1400  BP: (!) 157/107 123/80  (!) 144/97  Pulse: (!) 103 68  76  Resp: 20 18  18   Temp: 100.1 F (37.8 C) (!) 97.5 F (36.4 C) 98.1 F (36.7 C) 97.6 F (36.4 C)  TempSrc: Oral Oral  Oral  SpO2: 96% 100%  96%  Weight: 95.5 kg     Height: 5\' 6"  (1.676 m)       Intake/Output Summary (Last 24 hours) at 02/12/2018 1609 Last data filed at 02/12/2018 1500 Gross per 24 hour  Intake 2505.65 ml  Output -  Net 2505.65 ml   Filed Weights   02/11/18 2300  Weight: 95.5 kg    Examination: General exam: Appears comfortable  HEENT: PERRLA, oral mucosa moist, no sclera icterus or thrush- vesicles and ulcers noted lips, on tongue and mouth Respiratory system: Clear to auscultation. Respiratory effort normal. Cardiovascular system: S1 & S2 heard, RRR.   Gastrointestinal system: Abdomen soft, non-tender, nondistended. Normal bowel sound. No organomegaly Central nervous system: Alert and oriented. No focal neurological deficits. Extremities: No cyanosis, clubbing or edema Skin: No rashes or ulcers Psychiatry:  Mood & affect appropriate.   Data Reviewed: I have personally reviewed following labs and imaging studies  CBC: Recent Labs  Lab 02/11/18 1824 02/12/18 0316  WBC 14.8* 13.4*  NEUTROABS 11.4*  --   HGB 15.8 14.3  HCT 45.6 42.5  MCV 83.2 83.7  PLT 287 265   Basic Metabolic Panel: Recent Labs  Lab 02/11/18 1824 02/12/18 0316  NA 144 140  K 3.5 4.2  CL 108 107  CO2 24 23  GLUCOSE 92 143*  BUN 13 13  CREATININE 1.15 1.02  CALCIUM 9.8 8.8*  MG  --  2.1  PHOS  --  4.2   GFR: Estimated Creatinine Clearance: 116.7 mL/min (by C-G formula based on SCr of  1.02 mg/dL). Liver Function Tests: Recent Labs  Lab 02/12/18 0316  AST 16  ALT 23  ALKPHOS 39  BILITOT 0.5  PROT 7.3  ALBUMIN 3.8   No results for input(s): LIPASE, AMYLASE in the last 168 hours. No results for input(s): AMMONIA in the last 168 hours. Coagulation Profile: Recent Labs  Lab 02/12/18 0000  INR 0.97   Cardiac Enzymes: No results for input(s): CKTOTAL, CKMB, CKMBINDEX, TROPONINI in the last 168 hours. BNP (last 3 results) No results for input(s): PROBNP in the last 8760 hours. HbA1C: No results for input(s): HGBA1C in the last 72 hours. CBG: No results for input(s): GLUCAP in the last 168 hours. Lipid Profile: No results for input(s): CHOL, HDL, LDLCALC, TRIG, CHOLHDL, LDLDIRECT in the last 72 hours. Thyroid Function  Tests: Recent Labs    02/11/18 1824  TSH 0.878   Anemia Panel: No results for input(s): VITAMINB12, FOLATE, FERRITIN, TIBC, IRON, RETICCTPCT in the last 72 hours. Urine analysis:    Component Value Date/Time   COLORURINE YELLOW 02/18/2017 2129   APPEARANCEUR CLEAR 02/18/2017 2129   APPEARANCEUR Clear 10/12/2015 1623   LABSPEC 1.023 02/18/2017 2129   PHURINE 5.0 02/18/2017 2129   GLUCOSEU NEGATIVE 02/18/2017 2129   HGBUR NEGATIVE 02/18/2017 2129   BILIRUBINUR NEGATIVE 02/18/2017 2129   BILIRUBINUR Negative 10/12/2015 1623   KETONESUR NEGATIVE 02/18/2017 2129   PROTEINUR NEGATIVE 02/18/2017 2129   UROBILINOGEN 0.2 12/15/2011 1409   UROBILINOGEN 0.2 11/27/2011 1654   NITRITE NEGATIVE 02/18/2017 2129   LEUKOCYTESUR NEGATIVE 02/18/2017 2129   LEUKOCYTESUR Negative 10/12/2015 1623   Sepsis Labs: @LABRCNTIP (procalcitonin:4,lacticidven:4) ) Recent Results (from the past 240 hour(s))  Culture, blood (routine x 2)     Status: None (Preliminary result)   Collection Time: 02/11/18  6:22 PM  Result Value Ref Range Status   Specimen Description   Final    BLOOD LEFT ANTECUBITAL Performed at [facility], [address], [place], [place] [address]    Special Requests   Final    BOTTLES DRAWN AEROBIC AND ANAEROBIC Blood Culture results may not be optimal due to an excessive volume of blood received in culture bottles Performed at [facility], [address], [place], [place] [address]    Culture   Final    NO GROWTH < 24 HOURS Performed at [facility], [address] [place], [place] [address]    Report Status PENDING  Incomplete  Culture, blood (routine x 2)     Status: None (Preliminary result)   Collection Time: 02/11/18  6:52 PM  Result Value Ref Range Status   Specimen Description   Final    BLOOD RIGHT ANTECUBITAL Performed at [facility], [address] [place], [place] [address]    Special Requests    Final    BOTTLES DRAWN AEROBIC AND ANAEROBIC Blood Culture results may not be optimal due to an excessive volume of blood received in culture bottles Performed at [facility], [address], [place], [place] [address]    Culture   Final    NO GROWTH < 24 HOURS Performed at [facility], [address], [place], [place] [address]    Report Status PENDING  Incomplete  Group A Strep by PCR     Status: None   Collection Time: 02/11/18  8:25 PM  Result Value Ref Range Status   Group A Strep by PCR NOT DETECTED NOT DETECTED Final    Comment:  Performed at [facility], [address] [place], [place] [address]         Radiology Studies: Ct Soft Tissue Neck W Contrast  Result Date: 02/11/2018 CLINICAL DATA:  Sore throat for a few days, mouth sores and body aches. EXAM: CT NECK WITH CONTRAST TECHNIQUE: Multidetector CT imaging of the neck was performed using the standard protocol following the bolus administration of intravenous contrast. CONTRAST:  75mL OMNIPAQUE IOHEXOL 300 MG/ML  SOLN COMPARISON:  None. FINDINGS: PHARYNX AND LARYNX: Slightly edematous epiglottis and hypopharynx, narrowed RIGHT and effaced LEFT piriform sinuses. No retropharyngeal effusion. Patent airway. Normal larynx. SALIVARY GLANDS: Normal. THYROID: Normal. LYMPH NODES: 13 mm reniform LEFT level 2 a lymph node. Rounded 11 mm level 3 lymph nodes. VASCULAR: Normal. LIMITED INTRACRANIAL: Normal. VISUALIZED ORBITS: Normal. MASTOIDS AND VISUALIZED PARANASAL SINUSES: Well-aerated. SKELETON: Nonacute.  Tooth 15 dental carie. UPPER CHEST: Lung apices are clear. No superior mediastinal lymphadenopathy. OTHER: Trace submental effusion with mildly thickened platysma and subcutaneous fat stranding. Anterior neck edema extends to the strap muscles. IMPRESSION: 1. Mild pharyngitis including slightly edematous epiglottis. Patent airway. 2. Submental edema extending into anterior neck without focal  fluid collections. 3. Mild reactive lymphadenopathy. 4. Acute findings discussed with and reconfirmed by Dr.MICHAEL BUTLER on 02/11/2018 at 7:49 pm. Electronically Signed   By: Awilda Metro M.D.   On: 02/11/2018 19:50      Scheduled Meds: . acyclovir  400 mg Oral TID  . dexamethasone  4 mg Intravenous Q8H   Continuous Infusions: . sodium chloride    . clindamycin (CLEOCIN) IV Stopped (02/12/18 1358)     LOS: 0 days    Time spent in minutes: 35    Calvert Cantor, MD [facility] Pager: [email or web address] Password [email or web address] 02/12/2018, 4:09 PM

## 2018-02-13 DIAGNOSIS — N200 Calculus of kidney: Secondary | ICD-10-CM

## 2018-02-13 DIAGNOSIS — Z8719 Personal history of other diseases of the digestive system: Secondary | ICD-10-CM

## 2018-02-13 DIAGNOSIS — I10 Essential (primary) hypertension: Secondary | ICD-10-CM

## 2018-02-13 LAB — HEMOGLOBIN A1C
Hgb A1c MFr Bld: 5.6 % (ref 4.8–5.6)
MEAN PLASMA GLUCOSE: 114 mg/dL

## 2018-02-13 MED ORDER — ACYCLOVIR 400 MG PO TABS: 400.0000 mg | ORAL_TABLET | Freq: Three times a day (TID) | ORAL | 0 refills | Status: DC

## 2018-02-13 MED ORDER — HYDRALAZINE HCL 20 MG/ML IJ SOLN
10.0000 mg | Freq: Four times a day (QID) | INTRAMUSCULAR | Status: DC | PRN
  Administered 2018-02-13: 10 mg via INTRAVENOUS
  Filled 2018-02-13: qty 1

## 2018-02-13 MED ORDER — MENTHOL 3 MG MT LOZG: 1.0000 | LOZENGE | OROMUCOSAL | 12 refills | Status: DC | PRN

## 2018-02-13 MED ORDER — ACETAMINOPHEN 325 MG PO TABS: 650.0000 mg | ORAL_TABLET | Freq: Four times a day (QID) | ORAL | Status: DC | PRN

## 2018-02-13 MED ORDER — PREDNISONE 10 MG (21) PO TBPK: 10.0000 mg | ORAL_TABLET | Freq: Every day | ORAL | 0 refills | Status: DC

## 2018-02-13 MED ORDER — MAGIC MOUTHWASH W/LIDOCAINE: 10.0000 mL | Freq: Four times a day (QID) | ORAL | 1 refills | Status: DC | PRN

## 2018-02-13 MED ORDER — OXYCODONE HCL 5 MG PO TABS: 5.0000 mg | ORAL_TABLET | ORAL | 0 refills | Status: DC | PRN

## 2018-02-13 MED ORDER — AMOXICILLIN-POT CLAVULANATE 875-125 MG PO TABS: 1.0000 | ORAL_TABLET | Freq: Two times a day (BID) | ORAL | 0 refills | Status: AC

## 2018-02-13 MED ORDER — MAGIC MOUTHWASH W/LIDOCAINE
10.0000 mL | Freq: Four times a day (QID) | ORAL | Status: DC | PRN
  Administered 2018-02-13 (×2): 10 mL via ORAL
  Filled 2018-02-13 (×4): qty 10

## 2018-02-13 MED ORDER — ONDANSETRON HCL 4 MG PO TABS: 4.0000 mg | ORAL_TABLET | Freq: Four times a day (QID) | ORAL | 0 refills | Status: DC | PRN

## 2018-02-13 NOTE — Discharge Summary (Signed)
Physician Discharge Summary  Leno Mathes Macdonell ZOX:[id number] DOB: May 27, 1990 DOA: 02/11/2018  PCP: Patient, No Pcp Per  Admit date: 02/11/2018 Discharge date: 02/13/2018  Admitted From: home  Disposition:  home   Recommendations for Outpatient Follow-up:  1. Full code    Discharge Condition:  stable   CODE STATUS:  Full code   Diet recommendation:  Soft diet Consultations:  ENT     Discharge Diagnoses:  Principal Problem:   Pharyngitis Active Problems:   Sepsis (HCC)   Recurrent nephrolithiasis   H/O gastroesophageal reflux (GERD)   Vitamin B 12 deficiency   HTN (hypertension)      Brief Summary: Morrell Riddle with HTN, asthma, renal stones admitted for mouth sore, throat pain who has failed outpt Keflex. He presented with a fever 103 and swelling in the throat as well.   Hospital Course:  Active Problems:   Sepsis     Pharyngitis- likely viral with superimposed bacterial infection - he has been receiving Acyclovir, Decadron, Toradol & Clindamycin in the hospital- have had to add magic mouthwash with Lidocaine and IV Dilaudid for pain- - ENT evaluated him on 10/3- fiberoptic laryngoscopy did not reveal any involvement of the pharynx with lesions -  he is significantly improved and states throat swelling has resolved and pain is well controlled on current medications- he is able to eat and drink and take medications without significant issues-  will d/c home- see med list below for discharge meds   Asthma - stable  HTN - cont home meds    Discharge Exam: Vitals:   02/13/18 0917 02/13/18 1047  BP: (!) 154/105 (!) 159/103  Pulse: 81 73  Resp: 20 18  Temp: 97.7 F (36.5 C) 97.7 F (36.5 C)  SpO2: 96% 97%   Vitals:   02/13/18 0131 02/13/18 0656 02/13/18 0917 02/13/18 1047  BP: (!) 164/107 (!) 145/96 (!) 154/105 (!) 159/103  Pulse:  77 81 73  Resp:  17 20 18   Temp:  98.1 F (36.7 C) 97.7 F (36.5 C) 97.7 F (36.5 C)  TempSrc:  Oral     SpO2:  96% 96% 97%  Weight:      Height:        General: Pt is alert, awake, not in acute distress HEENT: ulcers on lips, in mouth and on tongue Cardiovascular: RRR, S1/S2 +, no rubs, no gallops Respiratory: CTA bilaterally, no wheezing, no rhonchi Abdominal: Soft, NT, ND, bowel sounds + Extremities: no edema, no cyanosis   Discharge Instructions  Discharge Instructions    Diet - low sodium heart healthy   Complete by:  As directed    Increase activity slowly   Complete by:  As directed      Allergies as of 02/13/2018   No Known Allergies     Medication List    STOP taking these medications   KEFLEX 250 MG capsule Generic drug:  cephALEXin     TAKE these medications   acetaminophen 325 MG tablet Commonly known as:  TYLENOL Take 2 tablets (650 mg total) by mouth every 6 (six) hours as needed for mild pain (or Fever >/= 101).   acyclovir 400 MG tablet Commonly known as:  ZOVIRAX Take 1 tablet (400 mg total) by mouth 3 (three) times daily.   albuterol 108 (90 Base) MCG/ACT inhaler Commonly known as:  PROVENTIL HFA;VENTOLIN HFA Inhale 2 puffs into the lungs every 4 (four) hours as needed for wheezing or shortness of breath. Reported on 09/18/2015  amLODipine 5 MG tablet Commonly known as:  NORVASC Take 1 tablet (5 mg total) by mouth daily.   amoxicillin-clavulanate 875-125 MG tablet Commonly known as:  AUGMENTIN Take 1 tablet by mouth every 12 (twelve) hours for 5 days.   chlorpheniramine-HYDROcodone 10-8 MG/5ML Suer Commonly known as:  TUSSIONEX Take 5 mLs by mouth every 12 (twelve) hours as needed for cough.   dicyclomine 20 MG tablet Commonly known as:  BENTYL Take 1 tablet (20 mg total) by mouth 3 (three) times daily as needed (abdominal cramping).   fluticasone 50 MCG/ACT nasal spray Commonly known as:  FLONASE Use one to two sprays in each nostril once daily as directed.   ibuprofen 800 MG tablet Commonly known as:  ADVIL,MOTRIN Take 1 tablet (800  mg total) by mouth every 8 (eight) hours as needed for mild pain or moderate pain.   losartan 50 MG tablet Commonly known as:  COZAAR TAKE ONE TABLET ONCE DAILY AS DIRECTED.   magic mouthwash w/lidocaine Soln Take 10 mLs by mouth 4 (four) times daily as needed for mouth pain.   menthol-cetylpyridinium 3 MG lozenge Commonly known as:  CEPACOL Take 1 lozenge (3 mg total) by mouth as needed for sore throat.   methocarbamol 500 MG tablet Commonly known as:  ROBAXIN Take 1 tablet (500 mg total) by mouth 2 (two) times daily.   omeprazole 20 MG capsule Commonly known as:  PRILOSEC Take one tablet by mouth once a day as directed.   ondansetron 4 MG disintegrating tablet Commonly known as:  ZOFRAN-ODT Take 1 tablet (4 mg total) by mouth every 8 (eight) hours as needed for nausea or vomiting.   ondansetron 4 MG tablet Commonly known as:  ZOFRAN Take 1 tablet (4 mg total) by mouth every 6 (six) hours as needed for nausea.   oxyCODONE 5 MG immediate release tablet Commonly known as:  Oxy IR/ROXICODONE Take 1 tablet (5 mg total) by mouth every 4 (four) hours as needed for severe pain.   predniSONE 10 MG (21) Tbpk tablet Commonly known as:  STERAPRED UNI-PAK 21 TAB Take 1 tablet (10 mg total) by mouth daily. Day 1: take 6 tabs.  Day 2: 5 tabs  Day 3: 4 tabs  Day 4: 3 tabs  Day 5: 2 tabs  Day 6: 1 tab   ranitidine 150 MG tablet Commonly known as:  ZANTAC Take 150 mg by mouth 2 (two) times daily as needed for heartburn.       No Known Allergies   Procedures/Studies:    Ct Soft Tissue Neck W Contrast  Result Date: 02/11/2018 CLINICAL DATA:  Sore throat for a few days, mouth sores and body aches. EXAM: CT NECK WITH CONTRAST TECHNIQUE: Multidetector CT imaging of the neck was performed using the standard protocol following the bolus administration of intravenous contrast. CONTRAST:  75mL OMNIPAQUE IOHEXOL 300 MG/ML  SOLN COMPARISON:  None. FINDINGS: PHARYNX AND LARYNX: Slightly  edematous epiglottis and hypopharynx, narrowed RIGHT and effaced LEFT piriform sinuses. No retropharyngeal effusion. Patent airway. Normal larynx. SALIVARY GLANDS: Normal. THYROID: Normal. LYMPH NODES: 13 mm reniform LEFT level 2 a lymph node. Rounded 11 mm level 3 lymph nodes. VASCULAR: Normal. LIMITED INTRACRANIAL: Normal. VISUALIZED ORBITS: Normal. MASTOIDS AND VISUALIZED PARANASAL SINUSES: Well-aerated. SKELETON: Nonacute.  Tooth 15 dental carie. UPPER CHEST: Lung apices are clear. No superior mediastinal lymphadenopathy. OTHER: Trace submental effusion with mildly thickened platysma and subcutaneous fat stranding. Anterior neck edema extends to the strap muscles. IMPRESSION: 1. Mild pharyngitis including  slightly edematous epiglottis. Patent airway. 2. Submental edema extending into anterior neck without focal fluid collections. 3. Mild reactive lymphadenopathy. 4. Acute findings discussed with and reconfirmed by Dr.MICHAEL BUTLER on 02/11/2018 at 7:49 pm. Electronically Signed   By: Awilda Metro M.D.   On: 02/11/2018 19:50     The results of significant diagnostics from this hospitalization (including imaging, microbiology, ancillary and laboratory) are listed below for reference.     Microbiology: Recent Results (from the past 240 hour(s))  Culture, blood (routine x 2)     Status: None (Preliminary result)   Collection Time: 02/11/18  6:22 PM  Result Value Ref Range Status   Specimen Description   Final    BLOOD LEFT ANTECUBITAL Performed at [facility], [address] [place], [place] [address]    Special Requests   Final    BOTTLES DRAWN AEROBIC AND ANAEROBIC Blood Culture results may not be optimal due to an excessive volume of blood received in culture bottles Performed at [facility], [address], [place], [place] [address]    Culture   Final    NO GROWTH 2 DAYS Performed at [facility], [address], [place], [place]  [address]    Report Status PENDING  Incomplete  Culture, blood (routine x 2)     Status: None (Preliminary result)   Collection Time: 02/11/18  6:52 PM  Result Value Ref Range Status   Specimen Description   Final    BLOOD RIGHT ANTECUBITAL Performed at [facility], [address] [place], [place] [address]    Special Requests   Final    BOTTLES DRAWN AEROBIC AND ANAEROBIC Blood Culture results may not be optimal due to an excessive volume of blood received in culture bottles Performed at [facility], [address], [place], [place] [address]    Culture   Final    NO GROWTH 2 DAYS Performed at [facility], [address] [place], [place] [address]    Report Status PENDING  Incomplete  Group A Strep by PCR     Status: None   Collection Time: 02/11/18  8:25 PM  Result Value Ref Range Status   Group A Strep by PCR NOT DETECTED NOT DETECTED Final    Comment: Performed at [facility], [address] [place], [place] [address]     Labs: BNP (last 3 results) No results for input(s): BNP in the last 8760 hours. Basic Metabolic Panel: Recent Labs  Lab 02/11/18 1824 02/12/18 0316  NA 144 140  K 3.5 4.2  CL 108 107  CO2 24 23  GLUCOSE 92 143*  BUN 13 13  CREATININE 1.15 1.02  CALCIUM 9.8 8.8*  MG  --  2.1  PHOS  --  4.2   Liver Function Tests: Recent Labs  Lab 02/12/18 0316  AST 16  ALT 23  ALKPHOS 39  BILITOT 0.5  PROT 7.3  ALBUMIN 3.8   No results for input(s): LIPASE, AMYLASE in the last 168 hours. No results for input(s): AMMONIA in the last 168 hours. CBC: Recent Labs  Lab 02/11/18 1824 02/12/18 0316  WBC 14.8* 13.4*  NEUTROABS 11.4*  --   HGB 15.8 14.3  HCT 45.6 42.5  MCV 83.2 83.7  PLT 287 265   Cardiac Enzymes: No results for input(s): CKTOTAL, CKMB, CKMBINDEX, TROPONINI in the last 168 hours. BNP: Invalid input(s): POCBNP CBG: No results for input(s): GLUCAP  in the last 168  hours. D-Dimer No results for input(s): DDIMER in the last 72 hours. Hgb A1c Recent Labs    02/12/18 0316  HGBA1C 5.6   Lipid Profile No results for input(s): CHOL, HDL, LDLCALC, TRIG, CHOLHDL, LDLDIRECT in the last 72 hours. Thyroid function studies Recent Labs    02/11/18 1824  TSH 0.878   Anemia work up No results for input(s): VITAMINB12, FOLATE, FERRITIN, TIBC, IRON, RETICCTPCT in the last 72 hours. Urinalysis    Component Value Date/Time   COLORURINE YELLOW 02/18/2017 2129   APPEARANCEUR CLEAR 02/18/2017 2129   APPEARANCEUR Clear 10/12/2015 1623   LABSPEC 1.023 02/18/2017 2129   PHURINE 5.0 02/18/2017 2129   GLUCOSEU NEGATIVE 02/18/2017 2129   HGBUR NEGATIVE 02/18/2017 2129   BILIRUBINUR NEGATIVE 02/18/2017 2129   BILIRUBINUR Negative 10/12/2015 1623   KETONESUR NEGATIVE 02/18/2017 2129   PROTEINUR NEGATIVE 02/18/2017 2129   UROBILINOGEN 0.2 12/15/2011 1409   UROBILINOGEN 0.2 11/27/2011 1654   NITRITE NEGATIVE 02/18/2017 2129   LEUKOCYTESUR NEGATIVE 02/18/2017 2129   LEUKOCYTESUR Negative 10/12/2015 1623   Sepsis Labs Invalid input(s): PROCALCITONIN,  WBC,  LACTICIDVEN Microbiology Recent Results (from the past 240 hour(s))  Culture, blood (routine x 2)     Status: None (Preliminary result)   Collection Time: 02/11/18  6:22 PM  Result Value Ref Range Status   Specimen Description   Final    BLOOD LEFT ANTECUBITAL Performed at [facility], [address], [place], [place] [address]    Special Requests   Final    BOTTLES DRAWN AEROBIC AND ANAEROBIC Blood Culture results may not be optimal due to an excessive volume of blood received in culture bottles Performed at [facility], [address] [place], [place] [address]    Culture   Final    NO GROWTH 2 DAYS Performed at [facility], [address], [place], [place] [address]    Report Status PENDING  Incomplete  Culture, blood (routine x 2)     Status: None  (Preliminary result)   Collection Time: 02/11/18  6:52 PM  Result Value Ref Range Status   Specimen Description   Final    BLOOD RIGHT ANTECUBITAL Performed at [facility], [address], [place], [place] [address]    Special Requests   Final    BOTTLES DRAWN AEROBIC AND ANAEROBIC Blood Culture results may not be optimal due to an excessive volume of blood received in culture bottles Performed at [facility], [address] [place], [place] [address]    Culture   Final    NO GROWTH 2 DAYS Performed at [facility], [address] [place], [place] [address]    Report Status PENDING  Incomplete  Group A Strep by PCR     Status: None   Collection Time: 02/11/18  8:25 PM  Result Value Ref Range Status   Group A Strep by PCR NOT DETECTED NOT DETECTED Final    Comment: Performed at [facility], [address], [place], [place] [address]     Time coordinating discharge in minutes: 60  SIGNED:   Calvert Cantor, MD  [facility] 02/13/2018, 12:55 PM Pager   If 7PM-7AM, please contact night-coverage [email or web address] Password [email or web address]

## 2018-02-16 LAB — CULTURE, BLOOD (ROUTINE X 2)
Culture: NO GROWTH
Culture: NO GROWTH

## 2019-04-30 IMAGING — CT CT NECK W/ CM
4 of 5 series · 15 of 33 positions shown, 17 images · IV contrast (omnipaque)
Comparison: None.

CLINICAL DATA: Sore throat for a few days, mouth sores and body
aches.

EXAM:
CT NECK WITH CONTRAST
TECHNIQUE: Multidetector CT imaging of the neck was performed using the
standard protocol following the bolus administration of intravenous
contrast.
CONTRAST:  75mL OMNIPAQUE IOHEXOL 300 MG/ML  SOLN

[Series 3: axial neck · axial · 0.46mm/px · z∈[-242,-102]mm · 4 of 118 slices shown, 5 images]
[im 24/118  soft-tissue]
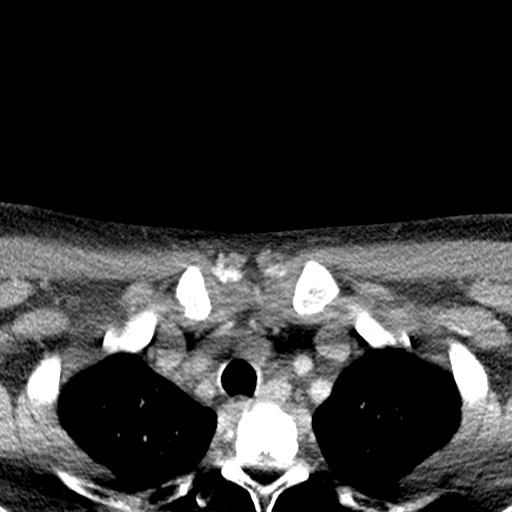
[im 24/118  bone]
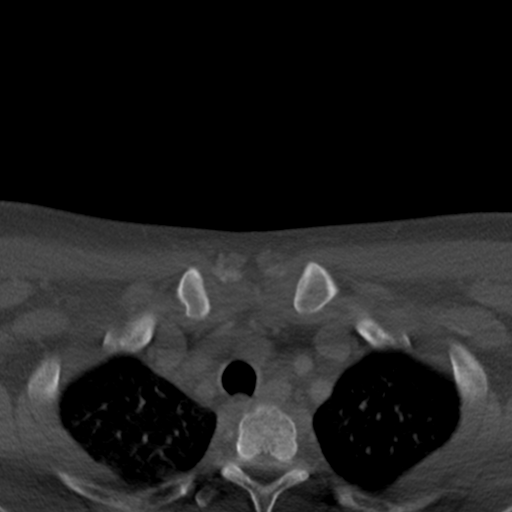
[im 47/118  bone]
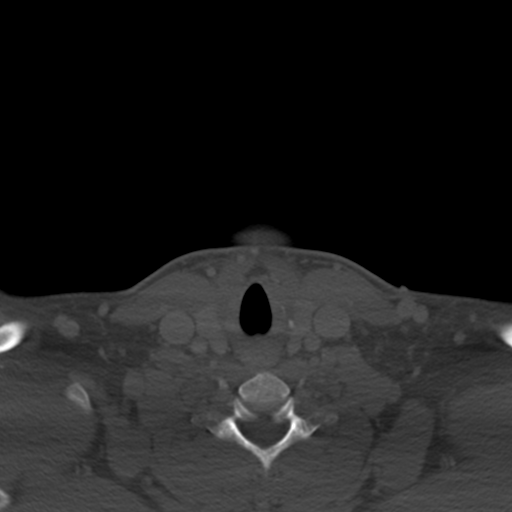
[im 71/118  bone]
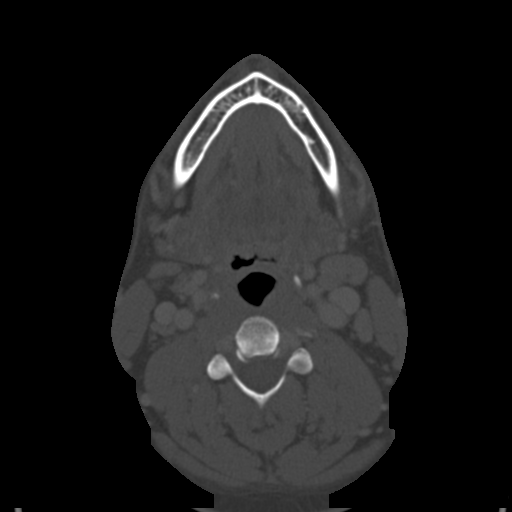
[im 94/118  bone]
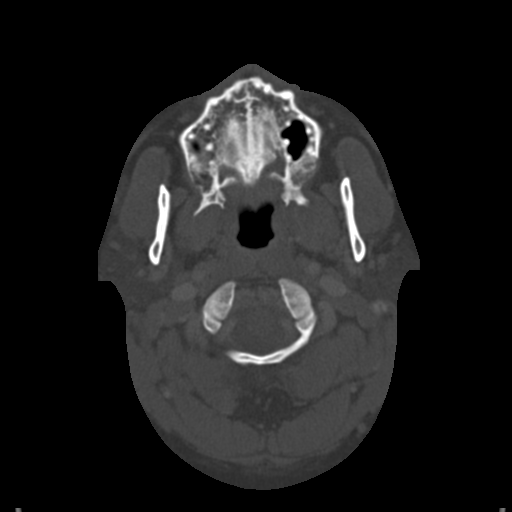

[Series 5: orthogonal ax · axial · 0.35mm/px · z∈[-261,-166]mm · 3 of 123 slices shown]
[im 25/123  bone]
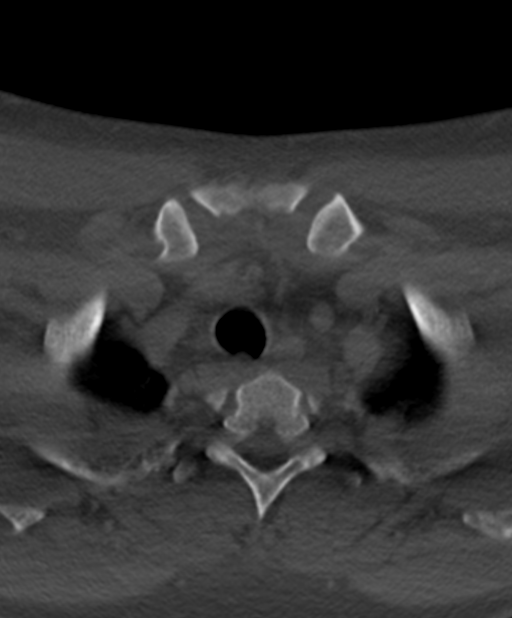
[im 49/123  bone]
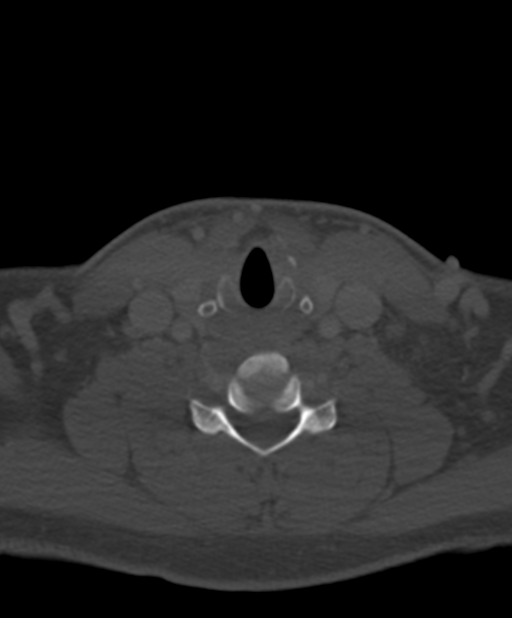
[im 74/123  bone]
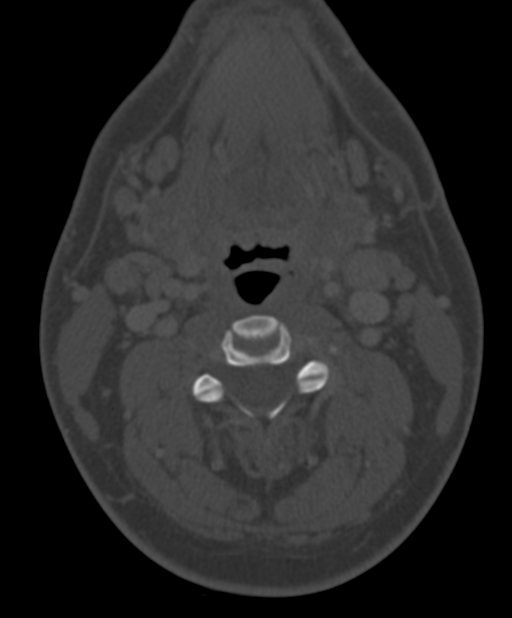

[Series 6: cor neck · coronal · 0.35mm/px · 3 of 101 slices shown]
[im 32/101  bone]
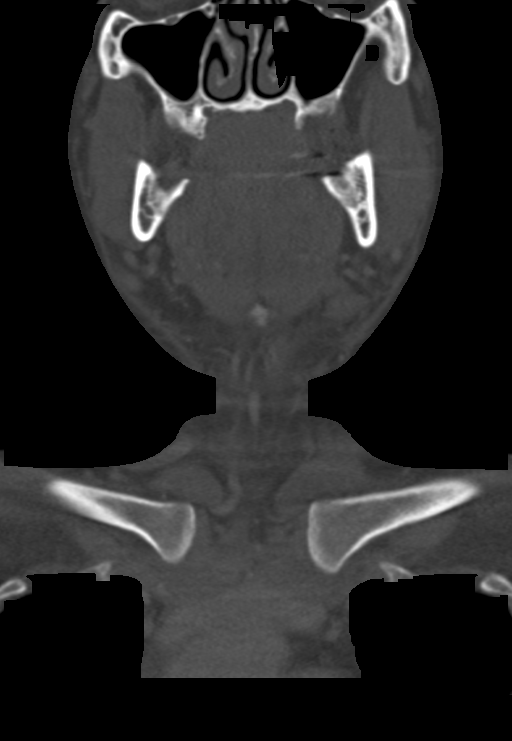
[im 44/101  bone]
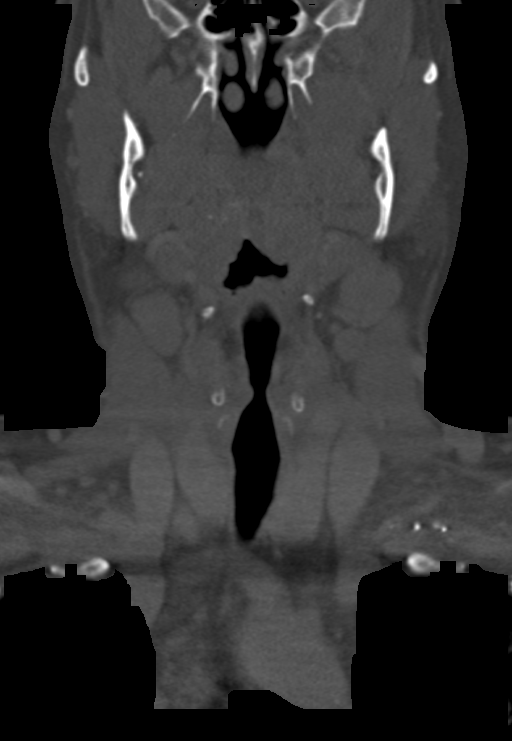
[im 57/101  bone]
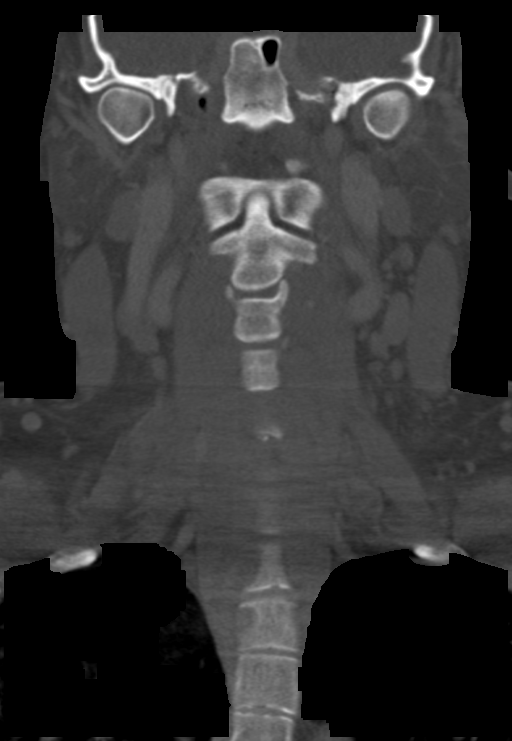

[Series 7: sag neck · sagittal · 0.46mm/px · 5 of 81 slices shown, 6 images]
[im 27/81  bone]
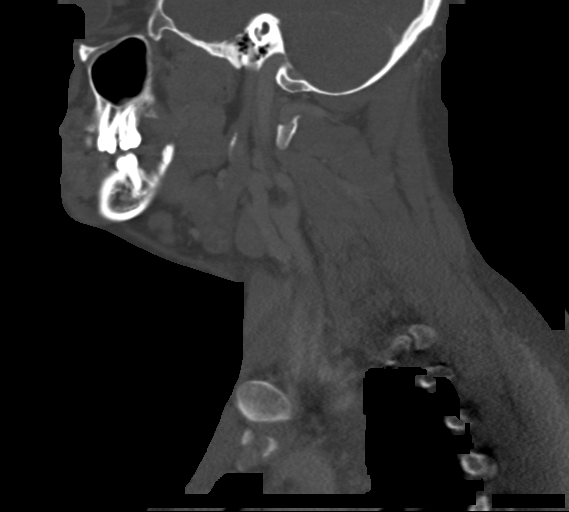
[im 34/81  bone]
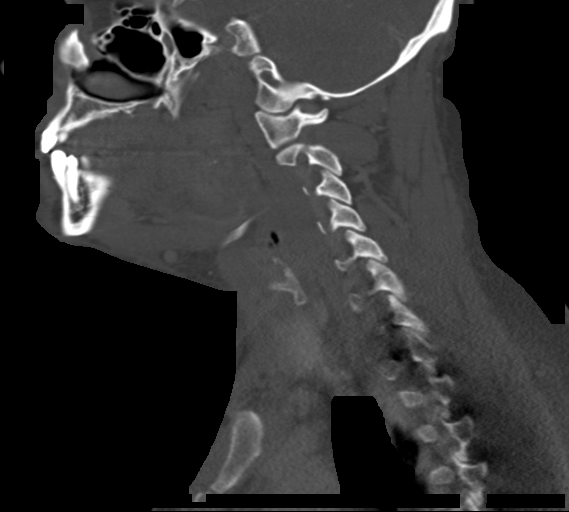
[im 41/81  soft-tissue]
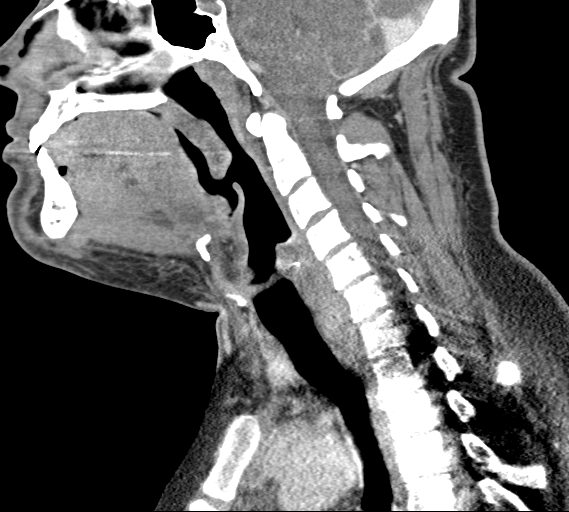
[im 41/81  bone]
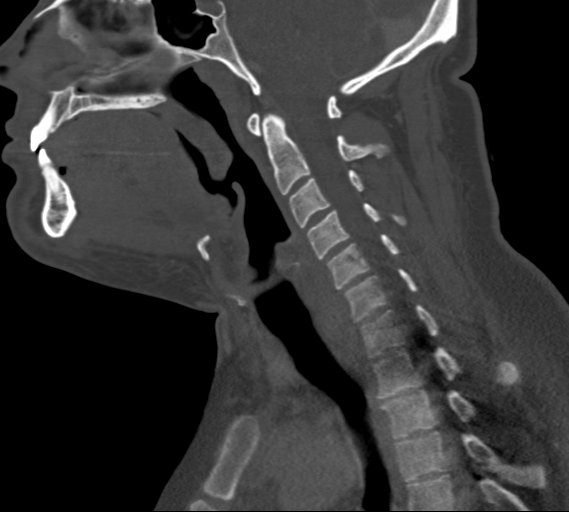
[im 47/81  bone]
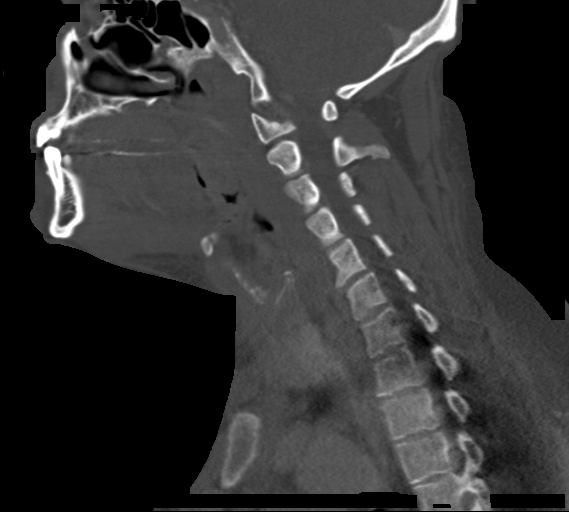
[im 54/81  bone]
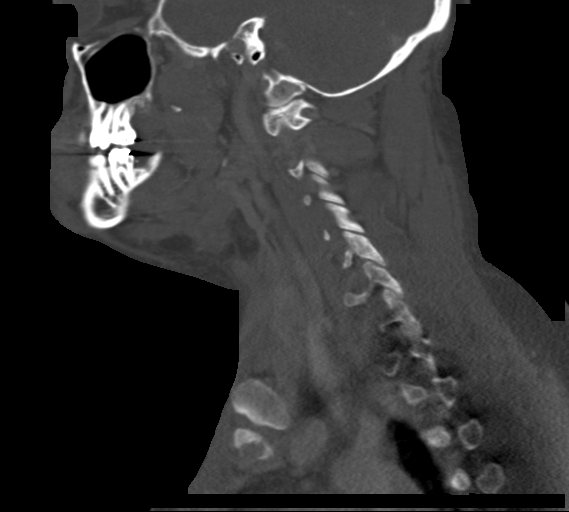

[15 of 33 positions shown; findings below may reference images not displayed]

FINDINGS: PHARYNX AND LARYNX: Slightly edematous epiglottis and hypopharynx,
narrowed RIGHT and effaced LEFT piriform sinuses. No retropharyngeal
effusion. Patent airway. Normal larynx.

SALIVARY GLANDS: Normal.

THYROID: Normal.

LYMPH NODES: 13 mm reniform LEFT level 2 a lymph node. Rounded 11 mm
level 3 lymph nodes.

VASCULAR: Normal.

LIMITED INTRACRANIAL: Normal.

VISUALIZED ORBITS: Normal.

MASTOIDS AND VISUALIZED PARANASAL SINUSES: Well-aerated.

SKELETON: Nonacute.  Tooth 15 dental Tc Evren.

UPPER CHEST: Lung apices are clear. No superior mediastinal
lymphadenopathy.

OTHER: Trace submental effusion with mildly thickened platysma and
subcutaneous fat stranding. Anterior neck edema extends to the strap
muscles.
IMPRESSION: 1. Mild pharyngitis including slightly edematous epiglottis. Patent
airway.
2. Submental edema extending into anterior neck without focal fluid
collections.
3. Mild reactive lymphadenopathy.
4. Acute findings discussed with and reconfirmed by Dr.TIGER
JIM on 02/11/2018 at [DATE].

## 2019-09-22 ENCOUNTER — Other Ambulatory Visit: Payer: Self-pay

## 2019-09-22 ENCOUNTER — Ambulatory Visit (INDEPENDENT_AMBULATORY_CARE_PROVIDER_SITE_OTHER): Payer: 59 | Admitting: Allergy and Immunology

## 2019-09-22 ENCOUNTER — Encounter: Payer: Self-pay | Admitting: Allergy and Immunology

## 2019-09-22 VITALS — BP 136/90 | HR 85 | Temp 98.2°F | Resp 18 | Ht 66.5 in | Wt 186.3 lb

## 2019-09-22 DIAGNOSIS — K219 Gastro-esophageal reflux disease without esophagitis: Secondary | ICD-10-CM | POA: Diagnosis not present

## 2019-09-22 DIAGNOSIS — Z8709 Personal history of other diseases of the respiratory system: Secondary | ICD-10-CM

## 2019-09-22 DIAGNOSIS — J3089 Other allergic rhinitis: Secondary | ICD-10-CM | POA: Diagnosis not present

## 2019-09-22 NOTE — Progress Notes (Signed)
[facility] [place] [facility] [place]   Follow-up Note  Referring Provider: No ref. provider found Primary Provider: Patient, No Pcp Per Date of Office Visit: 09/22/2019  Subjective:   Cameron Bradshaw (DOB: 01-Oct-1990) is a 29 y.o. male who returns to the [facility] on 09/22/2019 in re-evaluation of the following:  HPI: Cameron Bradshaw returns to this clinic in evaluation of intermittent asthma and allergic rhinoconjunctivitis and reflux and tobacco use.  His last visit to this clinic was 12 November 2015.  His airway issue is a minimal issue at this point in time. He has had no issues with his nose or chest and does not use a short acting bronchodilator and rarely if ever uses a antihistamine.  He has discontinued his smoking hobby and that appears to have resulted in rather dramatic improvement regarding his airway status.  When I last saw him in this clinic he was having a issue with hypertension and we gave him amlodipine and now he has had lisinopril added to that medication and his high blood pressure is under very good control.  He has lost approximately 25 pounds and is exercising over the course of the past several months.  His reflux is under very good control as long as he continues on omeprazole.  He apparently has been discharged from his primary care doctor for some reason that I cannot really understand during today's evaluation.  He needs a new primary care doctor.  Allergies as of 09/22/2019   No Known Allergies     Medication List      amLODipine 5 MG tablet Commonly known as: NORVASC Take 1 tablet (5 mg total) by mouth daily.   lisinopril 20 MG tablet Commonly known as: ZESTRIL Take 20 mg by mouth daily.   omeprazole 20 MG capsule Commonly known as: PRILOSEC Take one tablet by mouth once a day as directed.       Past Medical History:  Diagnosis Date  . ADHD   . Asthma   . Hypertension   . Kidney stones     Past  Surgical History:  Procedure Laterality Date  . FRACTURE SURGERY      Review of systems negative except as noted in HPI / PMHx or noted below:  Review of Systems  Constitutional: Negative.   HENT: Negative.   Eyes: Negative.   Respiratory: Negative.   Cardiovascular: Negative.   Gastrointestinal: Negative.   Genitourinary: Negative.   Musculoskeletal: Negative.   Skin: Negative.   Neurological: Negative.   Endo/Heme/Allergies: Negative.   Psychiatric/Behavioral: Negative.      Objective:   Vitals:   09/22/19 1106  BP: 136/90  Pulse: 85  Resp: 18  Temp: 98.2 F (36.8 C)  SpO2: 98%   Height: 5' 6.5" (168.9 cm)  Weight: 186 lb 4.8 oz (84.5 kg)   Physical Exam Constitutional:      Appearance: He is not diaphoretic.  HENT:     Head: Normocephalic.     Right Ear: Tympanic membrane, ear canal and external ear normal.     Left Ear: Tympanic membrane, ear canal and external ear normal.     Nose: Nose normal. No mucosal edema or rhinorrhea.     Mouth/Throat:     Pharynx: Uvula midline. No oropharyngeal exudate.  Eyes:     Conjunctiva/sclera: Conjunctivae normal.  Neck:     Thyroid: No thyromegaly.     Trachea: Trachea normal. No tracheal tenderness or tracheal deviation.  Cardiovascular:     Rate and Rhythm: Normal rate and regular rhythm.     Heart sounds: Normal heart sounds, S1 normal and S2 normal. No murmur.  Pulmonary:     Effort: No respiratory distress.     Breath sounds: Normal breath sounds. No stridor. No wheezing or rales.  Lymphadenopathy:     Head:     Right side of head: No tonsillar adenopathy.     Left side of head: No tonsillar adenopathy.     Cervical: No cervical adenopathy.  Skin:    Findings: No erythema or rash.     Nails: There is no clubbing.  Neurological:     Mental Status: He is alert.     Diagnostics: none  Assessment and Plan:   1. Perennial allergic rhinitis   2. Gastroesophageal reflux disease, unspecified whether  esophagitis present   3. History of asthma     1.  [facility] appointment at 8 AM tomorrow  2.  Continue omeprazole 20 mg daily  3.  Continue OTC antihistamine if needed  4.  Obtain Covid vaccination  5.  Return to clinic if problems  Cameron Bradshaw appears to be doing very well on his current therapy which basically includes therapy for reflux and an as needed antihistamine.  He certainly needs to see a family physician regarding a multitude of different medical issues and we have set up an appointment for him to be seen by [facility] at 8:00 tomorrow.  Laurette Schimke, MD Allergy / Immunology [facility]

## 2019-09-22 NOTE — Patient Instructions (Addendum)
  1.  [facility] appointment at 8 AM tomorrow  2.  Continue omeprazole 20 mg daily  3.  Continue OTC antihistamine if needed  4.  Obtain Covid vaccination  5.  Return to clinic if problems

## 2019-09-23 ENCOUNTER — Encounter: Payer: Self-pay | Admitting: Allergy and Immunology

## 2023-08-10 ENCOUNTER — Emergency Department (HOSPITAL_BASED_OUTPATIENT_CLINIC_OR_DEPARTMENT_OTHER): Payer: Self-pay

## 2023-08-10 ENCOUNTER — Emergency Department (HOSPITAL_BASED_OUTPATIENT_CLINIC_OR_DEPARTMENT_OTHER)
Admission: EM | Admit: 2023-08-10 | Discharge: 2023-08-10 | Disposition: A | Discharge status: Home / Self Care | Payer: Self-pay | Attending: Emergency Medicine | Admitting: Emergency Medicine

## 2023-08-10 ENCOUNTER — Encounter (HOSPITAL_BASED_OUTPATIENT_CLINIC_OR_DEPARTMENT_OTHER): Payer: Self-pay | Admitting: Emergency Medicine

## 2023-08-10 ENCOUNTER — Other Ambulatory Visit: Payer: Self-pay

## 2023-08-10 DIAGNOSIS — Z79899 Other long term (current) drug therapy: Secondary | ICD-10-CM | POA: Insufficient documentation

## 2023-08-10 DIAGNOSIS — M25531 Pain in right wrist: Secondary | ICD-10-CM | POA: Insufficient documentation

## 2023-08-10 DIAGNOSIS — M79641 Pain in right hand: Secondary | ICD-10-CM | POA: Insufficient documentation

## 2023-08-10 MED ORDER — OXYCODONE-ACETAMINOPHEN 5-325 MG PO TABS
1.0000 | ORAL_TABLET | Freq: Once | ORAL | Status: AC
  Administered 2023-08-10: 1 via ORAL
  Filled 2023-08-10: qty 1

## 2023-08-10 NOTE — Discharge Instructions (Addendum)
 Today you were seen for right hand pain.  Please follow-up with orthopedics if your pain persist for further evaluation and treatment.  You may take Tylenol and Motrin as needed for pain.  Thank you for letting us treat you today. After performing a physical exam and reviewing your imaging, I feel you are safe to go home. Please follow up with your PCP in the next several days and provide them with your records from this visit. Return to the Emergency Room if pain becomes severe or symptoms worsen.

## 2023-08-10 NOTE — ED Provider Notes (Signed)
 [place] [facility] Provider Note   CSN: [id number] Arrival date & time: 08/10/23  1349     History  Chief Complaint  Patient presents with   Hand Pain    Cameron Bradshaw is a 33 y.o. male presents today for right hand/wrist pain times a couple days.  Patient denies any injury.  Patient denies numbness, tingling, fever, or any other complaints at this time.  Patient does endorse decreased grip strength due to pain.   Hand Pain       Home Medications Prior to Admission medications   Medication Sig Start Date End Date Taking? Authorizing Provider  amLODipine (NORVASC) 5 MG tablet Take 1 tablet (5 mg total) by mouth daily. 02/19/17   Ward, Chase Picket, PA-C  lisinopril (ZESTRIL) 20 MG tablet Take 20 mg by mouth daily. 09/02/19   [provider]  omeprazole (PRILOSEC) 20 MG capsule Take one tablet by mouth once a day as directed. 10/13/15   Kozlow, Alvira Philips, MD      Allergies    Patient has no known allergies.    Review of Systems   Review of Systems  Musculoskeletal:  Positive for arthralgias.    Physical Exam Updated Vital Signs BP (!) 169/124 (BP Location: Right Arm)   Pulse (!) 116   Temp 98.2 F (36.8 C)   Resp 16   Ht 5\' 6"  (1.676 m)   Wt 77.1 kg   SpO2 98%   BMI 27.44 kg/m  Physical Exam Vitals and nursing note reviewed.  Constitutional:      General: He is not in acute distress.    Appearance: Normal appearance. He is well-developed. He is not ill-appearing, toxic-appearing or diaphoretic.  HENT:     Head: Normocephalic and atraumatic.     Right Ear: External ear normal.     Left Ear: External ear normal.     Mouth/Throat:     Mouth: Mucous membranes are moist.  Eyes:     Extraocular Movements: Extraocular movements intact.     Conjunctiva/sclera: Conjunctivae normal.  Cardiovascular:     Rate and Rhythm: Normal rate and regular rhythm.     Pulses: Normal pulses.  Pulmonary:     Effort:  Pulmonary effort is normal. No respiratory distress.     Breath sounds: Normal breath sounds.  Abdominal:     Palpations: Abdomen is soft.  Musculoskeletal:        General: No swelling.     Cervical back: Neck supple.     Comments: Note obvious deformity or ecchymosis noted on the patient's right upper extremity.  Patient does have decreased grip strength on the right when compared to the left, likely due to pain.  Patient is neurovascularly intact.  No snuffbox tenderness.  Pain with pronation and supination of right arm.  No TTP of the carpal or cubital tunnel.  Skin:    General: Skin is warm and dry.     Capillary Refill: Capillary refill takes less than 2 seconds.  Neurological:     General: No focal deficit present.     Mental Status: He is alert.  Psychiatric:        Mood and Affect: Mood normal.     ED Results / Procedures / Treatments   Labs (all labs ordered are listed, but only abnormal results are displayed) Labs Reviewed - No data to display  EKG None  Radiology DG Wrist Complete Right Result Date: 08/10/2023 CLINICAL DATA:  Pain  EXAM: RIGHT WRIST - COMPLETE 3+ VIEW; RIGHT FOREARM - 2 VIEW COMPARISON:  None Available. FINDINGS: There is no evidence of fracture or dislocation of the right wrist or forearm. Minus ulnar variance. There is no evidence of arthropathy or other focal bone abnormality. Soft tissues are unremarkable. IMPRESSION: 1. No acute fracture or dislocation of the right wrist or forearm. 2. Minus ulnar variance. Electronically Signed   By: Duanne Guess D.O.   On: 08/10/2023 14:57   DG Forearm Right Result Date: 08/10/2023 CLINICAL DATA:  Pain EXAM: RIGHT WRIST - COMPLETE 3+ VIEW; RIGHT FOREARM - 2 VIEW COMPARISON:  None Available. FINDINGS: There is no evidence of fracture or dislocation of the right wrist or forearm. Minus ulnar variance. There is no evidence of arthropathy or other focal bone abnormality. Soft tissues are unremarkable. IMPRESSION: 1.  No acute fracture or dislocation of the right wrist or forearm. 2. Minus ulnar variance. Electronically Signed   By: Duanne Guess D.O.   On: 08/10/2023 14:57   DG Hand Complete Right Result Date: 08/10/2023 CLINICAL DATA:  Pt c/o RT hand pain x couple days; no injury; trouble opening and closing hand. pain, decreased ROM EXAM: RIGHT HAND - COMPLETE 3+ VIEW COMPARISON:  None Available. FINDINGS: There is no evidence of fracture or dislocation. Degenerative changes versus old triquetral fracture along the dorsum of the wrist. There is no evidence of arthropathy or other focal bone abnormality. Soft tissues are unremarkable. IMPRESSION: No acute displaced fracture or dislocation. Electronically Signed   By: Tish Frederickson M.D.   On: 08/10/2023 14:18    Procedures Procedures    Medications Ordered in ED Medications  oxyCODONE-acetaminophen (PERCOCET/ROXICET) 5-325 MG per tablet 1 tablet (1 tablet Oral Given 08/10/23 1450)    ED Course/ Medical Decision Making/ A&P                                 Medical Decision Making Amount and/or Complexity of Data Reviewed Radiology: ordered.   This patient presents to the ED with chief complaint(s) of right hand pain with pertinent past medical history of none which further complicates the presenting complaint. The complaint involves an extensive differential diagnosis and also carries with it a high risk of complications and morbidity.    The differential diagnosis includes carpal fracture, phalanx fracture, musculoskeletal pain, carpal tunnel syndrome, cubital tunnel syndrome  Additional history obtained: Records reviewed Care Everywhere/External Records  ED Course and Reassessment:   Independent visualization of imaging: - I independently visualized the following imaging with scope of interpretation limited to determining acute life threatening conditions related to emergency care: Right hand, forearm, wrist x-ray, which revealed no acute  displaced fracture or dislocation.  Degenerative changes versus old triquetral fracture along the dorsum of the wrist.   Consultation: - Consulted or discussed management/test interpretation w/ external professional: None  Consideration for admission or further workup: Consider for mission further workup however patient has no signs or symptoms concerning for osteomyelitis, scaphoid fracture, abscess, or cellulitis.  Patient symptoms likely due to overuse.  Patient placed in wrist brace prior to discharge and advised to take Tylenol/Motrin as needed for pain.  Patient should follow-up with orthopedics if his symptoms persist for further evaluation and treatment.        Final Clinical Impression(s) / ED Diagnoses Final diagnoses:  Right hand pain    Rx / DC Orders ED Discharge Orders     None  Dolphus Jenny, PA-C 08/10/23 1512    Royanne Foots, DO 08/11/23 [phone]

## 2023-08-10 NOTE — ED Triage Notes (Signed)
 Pt c/o RT hand pain x couple days; no injury; trouble opening and closing hand

## 2023-08-11 ENCOUNTER — Other Ambulatory Visit (HOSPITAL_BASED_OUTPATIENT_CLINIC_OR_DEPARTMENT_OTHER): Payer: Self-pay

## 2023-08-11 ENCOUNTER — Encounter (HOSPITAL_BASED_OUTPATIENT_CLINIC_OR_DEPARTMENT_OTHER): Payer: Self-pay | Admitting: Student

## 2023-08-11 ENCOUNTER — Ambulatory Visit (HOSPITAL_BASED_OUTPATIENT_CLINIC_OR_DEPARTMENT_OTHER): Payer: Self-pay | Admitting: Student

## 2023-08-11 DIAGNOSIS — M25531 Pain in right wrist: Secondary | ICD-10-CM

## 2023-08-11 MED ORDER — METHYLPREDNISOLONE 4 MG PO TBPK
ORAL_TABLET | ORAL | 0 refills | Status: DC
  Filled 2023-08-11: qty 21, 6d supply, fill #0

## 2023-08-11 NOTE — Progress Notes (Signed)
 Chief Complaint: Right wrist pain     History of Present Illness:    Cameron Bradshaw is a 33 y.o. right-hand-dominant male presenting today for evaluation of acute right wrist pain.  Patient states that this began a few days ago without any specific injury or overuse, and significantly worsened yesterday morning prompting a visit to the emergency department.  X-rays taken of the hand and wrist appeared negative and he was placed in a wrist brace.  Today he rates the pain at a 6/10 which started off in the dorsal wrist but has now moved to the volar aspect.  He has significantly increased pain with movement, particularly with pronation and supination.  He has also tried Tylenol, ibuprofen, and icing with little relief.  Denies any numbness or tingling no prior history of wrist pain or surgery.  No history of gout or diabetes.   Surgical History:   None  PMH/PSH/Family History/Social History/Meds/Allergies:    Past Medical History:  Diagnosis Date   ADHD    Asthma    Hypertension    Kidney stones    Past Surgical History:  Procedure Laterality Date   FRACTURE SURGERY     Social History   Socioeconomic History   Marital status: Single    Spouse name: Not on file   Number of children: Not on file   Years of education: Not on file   Highest education level: Not on file  Occupational History   Not on file  Tobacco Use   Smoking status: Every Day    Current packs/day: 0.50    Types: Cigarettes   Smokeless tobacco: Never  Vaping Use   Vaping status: Never Used  Substance and Sexual Activity   Alcohol use: Yes    Comment: occ   Drug use: No   Sexual activity: Not on file  Other Topics Concern   Not on file  Social History Narrative   Not on file   Social Drivers of Health   Financial Resource Strain: Not on file  Food Insecurity: Not on file  Transportation Needs: Not on file  Physical Activity: Not on file  Stress: Not on  file  Social Connections: Not on file   Family History  Problem Relation Age of Onset   Diabetes Father    No Known Allergies Current Outpatient Medications  Medication Sig Dispense Refill   methylPREDNISolone (MEDROL DOSEPAK) 4 MG TBPK tablet Take per packet instructions 21 each 0   amLODipine (NORVASC) 5 MG tablet Take 1 tablet (5 mg total) by mouth daily. 30 tablet 1   lisinopril (ZESTRIL) 20 MG tablet Take 20 mg by mouth daily.     omeprazole (PRILOSEC) 20 MG capsule Take one tablet by mouth once a day as directed. 30 capsule 5   No current facility-administered medications for this visit.   DG Wrist Complete Right Result Date: 08/10/2023 CLINICAL DATA:  Pain EXAM: RIGHT WRIST - COMPLETE 3+ VIEW; RIGHT FOREARM - 2 VIEW COMPARISON:  None Available. FINDINGS: There is no evidence of fracture or dislocation of the right wrist or forearm. Minus ulnar variance. There is no evidence of arthropathy or other focal bone abnormality. Soft tissues are unremarkable. IMPRESSION: 1. No acute fracture or dislocation of the right wrist or forearm. 2. Minus ulnar variance. Electronically Signed   By:  Nicholas  Plundo D.O.   On: 08/10/2023 14:57   DG Forearm Right Result Date: 08/10/2023 CLINICAL DATA:  Pain EXAM: RIGHT WRIST - COMPLETE 3+ VIEW; RIGHT FOREARM - 2 VIEW COMPARISON:  None Available. FINDINGS: There is no evidence of fracture or dislocation of the right wrist or forearm. Minus ulnar variance. There is no evidence of arthropathy or other focal bone abnormality. Soft tissues are unremarkable. IMPRESSION: 1. No acute fracture or dislocation of the right wrist or forearm. 2. Minus ulnar variance. Electronically Signed   By: Duanne Guess D.O.   On: 08/10/2023 14:57   DG Hand Complete Right Result Date: 08/10/2023 CLINICAL DATA:  Pt c/o RT hand pain x couple days; no injury; trouble opening and closing hand. pain, decreased ROM EXAM: RIGHT HAND - COMPLETE 3+ VIEW COMPARISON:  None Available.  FINDINGS: There is no evidence of fracture or dislocation. Degenerative changes versus old triquetral fracture along the dorsum of the wrist. There is no evidence of arthropathy or other focal bone abnormality. Soft tissues are unremarkable. IMPRESSION: No acute displaced fracture or dislocation. Electronically Signed   By: Tish Frederickson M.D.   On: 08/10/2023 14:18    Review of Systems:   A ROS was performed including pertinent positives and negatives as documented in the HPI.  Physical Exam :   Constitutional: NAD and appears stated age Neurological: Alert and oriented Psych: Appropriate affect and cooperative There were no vitals taken for this visit.   Comprehensive Musculoskeletal Exam:    Exam of the right wrist demonstrates no significant tenderness over the distal radius or ulna, snuffbox, or carpal bones.  There is tenderness along the extensors in the dorsal wrist.  Pain is elicited with active finger and wrist flexion/extension.  Negative Tinel's test.  Radial pulse 2+ and brisk capillary refill to all 5 fingers.  Distal sensation intact.  Imaging:   Xray review from ED on 08/10/2023 (right hand 3 views, right wrist 3 views): No evidence of acute bony abnormality   I personally reviewed and interpreted the radiographs.   Assessment:   33 y.o. male with worsening atraumatic right wrist pain.  X-rays taken in the ED yesterday were negative for any bony etiology.  He has not gotten any significant relief from bracing or pain medication thus far.  Pain is significantly worsened with any movement in the hand, wrist, or forearm and therefore I do have suspicion for any etiology inflammatory in nature such as tendinitis although difficult to pinpoint as he is tender diffusely.  No signs or symptoms that elevate concern for infection or gout.  In discussion of treatment options I have recommended continuing with bracing and we will plan to start him on a Medrol Dosepak.  Will have him  update [place] on symptoms if this does not show improvement within the next 3 to 4 days.  Plan :    -Continue bracing and start Medrol Dosepak -Return to clinic as needed     I personally saw and evaluated the patient, and participated in the management and treatment plan.  Hazle Nordmann, PA-C Orthopedics

## 2023-09-05 ENCOUNTER — Encounter (HOSPITAL_BASED_OUTPATIENT_CLINIC_OR_DEPARTMENT_OTHER): Payer: Self-pay | Admitting: Urology

## 2023-09-05 ENCOUNTER — Other Ambulatory Visit: Payer: Self-pay

## 2023-09-05 ENCOUNTER — Emergency Department (HOSPITAL_BASED_OUTPATIENT_CLINIC_OR_DEPARTMENT_OTHER)
Admission: EM | Admit: 2023-09-05 | Discharge: 2023-09-05 | Disposition: A | Discharge status: Home / Self Care | Payer: Self-pay | Attending: Emergency Medicine | Admitting: Emergency Medicine

## 2023-09-05 DIAGNOSIS — J45909 Unspecified asthma, uncomplicated: Secondary | ICD-10-CM | POA: Insufficient documentation

## 2023-09-05 DIAGNOSIS — S39012A Strain of muscle, fascia and tendon of lower back, initial encounter: Secondary | ICD-10-CM | POA: Insufficient documentation

## 2023-09-05 DIAGNOSIS — Z87442 Personal history of urinary calculi: Secondary | ICD-10-CM | POA: Insufficient documentation

## 2023-09-05 DIAGNOSIS — Z79899 Other long term (current) drug therapy: Secondary | ICD-10-CM | POA: Insufficient documentation

## 2023-09-05 DIAGNOSIS — Z7952 Long term (current) use of systemic steroids: Secondary | ICD-10-CM | POA: Insufficient documentation

## 2023-09-05 DIAGNOSIS — I10 Essential (primary) hypertension: Secondary | ICD-10-CM | POA: Insufficient documentation

## 2023-09-05 DIAGNOSIS — X58XXXA Exposure to other specified factors, initial encounter: Secondary | ICD-10-CM | POA: Insufficient documentation

## 2023-09-05 LAB — URINALYSIS, ROUTINE W REFLEX MICROSCOPIC
Bilirubin Urine: NEGATIVE
Glucose, UA: NEGATIVE mg/dL
Hgb urine dipstick: NEGATIVE
Ketones, ur: NEGATIVE mg/dL
Leukocytes,Ua: NEGATIVE
Nitrite: NEGATIVE
Protein, ur: NEGATIVE mg/dL
Specific Gravity, Urine: 1.02 (ref 1.005–1.030)
pH: 7 (ref 5.0–8.0)

## 2023-09-05 MED ORDER — METHOCARBAMOL 500 MG PO TABS: 500.0000 mg | ORAL_TABLET | Freq: Three times a day (TID) | ORAL | 0 refills | Status: AC | PRN

## 2023-09-05 MED ORDER — HYDROMORPHONE HCL 1 MG/ML IJ SOLN
0.5000 mg | Freq: Once | INTRAMUSCULAR | Status: AC
  Administered 2023-09-05: 0.5 mg via INTRAMUSCULAR
  Filled 2023-09-05: qty 1

## 2023-09-05 MED ORDER — KETOROLAC TROMETHAMINE 30 MG/ML IJ SOLN
30.0000 mg | Freq: Once | INTRAMUSCULAR | Status: AC
  Administered 2023-09-05: 30 mg via INTRAMUSCULAR
  Filled 2023-09-05: qty 1

## 2023-09-05 MED ORDER — OXYCODONE-ACETAMINOPHEN 5-325 MG PO TABS: 1.0000 | ORAL_TABLET | Freq: Four times a day (QID) | ORAL | 0 refills | Status: AC | PRN

## 2023-09-05 MED ORDER — PREDNISONE 20 MG PO TABS: 40.0000 mg | ORAL_TABLET | Freq: Every day | ORAL | 0 refills | Status: AC

## 2023-09-05 MED ORDER — DIAZEPAM 5 MG PO TABS
5.0000 mg | ORAL_TABLET | Freq: Once | ORAL | Status: AC
  Administered 2023-09-05: 5 mg via ORAL
  Filled 2023-09-05: qty 1

## 2023-09-05 NOTE — ED Notes (Signed)
 Pt states did not take BP meds today,

## 2023-09-05 NOTE — ED Provider Notes (Signed)
 [place] [facility] Provider Note   CSN: [id number] Arrival date & time: 09/05/23  8295     History  Chief Complaint  Patient presents with   Back Pain    Cameron Bradshaw is a 33 y.o. male.   Back Pain Patient presents with low back pain.  Some radiation to groin.  Began yesterday when he was bending over.  Felt sharp movement.  No fall.  No cancer history.  No loss of bladder or bowel control.  No dysuria.  No IV drug use.    Past Medical History:  Diagnosis Date   ADHD    Asthma    Hypertension    Kidney stones     Home Medications Prior to Admission medications   Medication Sig Start Date End Date Taking? Authorizing Provider  methocarbamol  (ROBAXIN ) 500 MG tablet Take 1 tablet (500 mg total) by mouth every 8 (eight) hours as needed. 09/05/23  Yes Mozell Arias, MD  oxyCODONE -acetaminophen  (PERCOCET/ROXICET) 5-325 MG tablet Take 1 tablet by mouth every 6 (six) hours as needed for severe pain (pain score 7-10). 09/05/23  Yes Mozell Arias, MD  predniSONE  (DELTASONE ) 20 MG tablet Take 2 tablets (40 mg total) by mouth daily. 09/05/23  Yes Mozell Arias, MD  amLODipine  (NORVASC ) 5 MG tablet Take 1 tablet (5 mg total) by mouth daily. 02/19/17   Ward, Jaime Pilcher, PA-C  lisinopril (ZESTRIL) 20 MG tablet Take 20 mg by mouth daily. 09/02/19   [provider]  omeprazole  (PRILOSEC) 20 MG capsule Take one tablet by mouth once a day as directed. 10/13/15   Kozlow, Rema Care, MD      Allergies    Patient has no known allergies.    Review of Systems   Review of Systems  Musculoskeletal:  Positive for back pain.    Physical Exam Updated Vital Signs BP (!) 191/127   Pulse 82   Temp 98 F (36.7 C) (Oral)   Resp 19   Ht 5\' 6"  (1.676 m)   Wt 81.6 kg   SpO2 97%   BMI 29.05 kg/m  Physical Exam Vitals reviewed.  Abdominal:     General: There is no distension.  Musculoskeletal:     Comments: Lumbar tenderness.   No step-off or deformity.  Neurological:     Mental Status: He is alert.     Comments: Perineal sensation intact.     ED Results / Procedures / Treatments   Labs (all labs ordered are listed, but only abnormal results are displayed) Labs Reviewed  URINALYSIS, ROUTINE W REFLEX MICROSCOPIC - Abnormal; Notable for the following components:      Result Value   Color, Urine STRAW (*)    All other components within normal limits    EKG None  Radiology No results found.  Procedures Procedures    Medications Ordered in ED Medications  ketorolac  (TORADOL ) 30 MG/ML injection 30 mg (30 mg Intramuscular Given 09/05/23 0947)  HYDROmorphone  (DILAUDID ) injection 0.5 mg (0.5 mg Intramuscular Given 09/05/23 0947)  diazepam  (VALIUM ) tablet 5 mg (5 mg Oral Given 09/05/23 1027)  HYDROmorphone  (DILAUDID ) injection 0.5 mg (0.5 mg Intramuscular Given 09/05/23 1148)    ED Course/ Medical Decision Making/ A&P                                 Medical Decision Making Amount and/or Complexity of Data Reviewed Labs: ordered.  Risk Prescription  drug management.   Patient with acute low back pain yesterday when bending over.  Now more severe.  Worse with certain positions.  No urinary symptoms.  Most likely musculoskeletal back pain.  Will give pain treatment.  Causes such as epidural abscess felt less likely.  Kidney stone also felt less likely.  Hypertensive here but has not had his medicine and has had pain.  Continued pain.  Feels somewhat better after treatment.  Urine does not show infection or blood.  Doubt kidney stone.  Most likely musculoskeletal versus other cause such as bulging disc.  No red flags.  Appears stable for discharge home with symptomatic treatment including steroids.        Final Clinical Impression(s) / ED Diagnoses Final diagnoses:  Strain of lumbar region, initial encounter    Rx / DC Orders ED Discharge Orders          Ordered    predniSONE  (DELTASONE ) 20  MG tablet  Daily        09/05/23 1140    methocarbamol  (ROBAXIN ) 500 MG tablet  Every 8 hours PRN        09/05/23 1140    oxyCODONE -acetaminophen  (PERCOCET/ROXICET) 5-325 MG tablet  Every 6 hours PRN        09/05/23 1140              Mozell Arias, MD 09/05/23 1438

## 2023-09-05 NOTE — ED Triage Notes (Signed)
 Pt states lower back pain since yesterday after bending over and feeling sharp pain Pain worse with certain movements and pain with walking/standing  Denies any h/o back injury

## 2023-09-05 NOTE — ED Notes (Signed)
D/c paperwork reviewed with pt, including prescriptions and follow up care.  All questions and/or concerns addressed at time of d/c.  No further needs expressed. . Pt verbalized understanding, Wheeled by family to ED exit, NAD.

## 2024-03-15 ENCOUNTER — Encounter: Payer: Self-pay | Admitting: Radiology
